# Patient Record
Sex: Female | Born: 1965 | Race: White | Hispanic: No | Marital: Married | Smoking: Never smoker
Health system: Southern US, Community
[De-identification: ages and names within clinical notes are randomized; demographics above are authoritative.]

## PROBLEM LIST (undated history)

## (undated) DIAGNOSIS — I10 Essential (primary) hypertension: Secondary | ICD-10-CM

## (undated) DIAGNOSIS — M2460 Ankylosis, unspecified joint: Secondary | ICD-10-CM

## (undated) DIAGNOSIS — R079 Chest pain, unspecified: Secondary | ICD-10-CM

## (undated) DIAGNOSIS — M329 Systemic lupus erythematosus, unspecified: Secondary | ICD-10-CM

## (undated) DIAGNOSIS — J45909 Unspecified asthma, uncomplicated: Secondary | ICD-10-CM

## (undated) HISTORY — DX: Chest pain, unspecified: R07.9

## (undated) HISTORY — PX: KNEE ARTHROSCOPY: SHX127

---

## 2009-12-27 HISTORY — PX: AUGMENTATION MAMMAPLASTY: SUR837

## 2014-06-11 ENCOUNTER — Ambulatory Visit: Payer: Self-pay | Admitting: Family Medicine

## 2015-10-31 ENCOUNTER — Other Ambulatory Visit: Payer: Self-pay | Admitting: Family Medicine

## 2015-10-31 DIAGNOSIS — Z1231 Encounter for screening mammogram for malignant neoplasm of breast: Secondary | ICD-10-CM

## 2015-11-13 ENCOUNTER — Ambulatory Visit
Admission: RE | Admit: 2015-11-13 | Discharge: 2015-11-13 | Disposition: A | Discharge status: Home / Self Care | Payer: BC Managed Care – PPO | Source: Ambulatory Visit | Attending: Family Medicine | Admitting: Family Medicine

## 2015-11-13 ENCOUNTER — Other Ambulatory Visit: Payer: Self-pay | Admitting: Family Medicine

## 2015-11-13 DIAGNOSIS — Z1231 Encounter for screening mammogram for malignant neoplasm of breast: Secondary | ICD-10-CM | POA: Insufficient documentation

## 2016-11-16 ENCOUNTER — Other Ambulatory Visit: Payer: Self-pay | Admitting: Family Medicine

## 2016-11-16 DIAGNOSIS — Z1231 Encounter for screening mammogram for malignant neoplasm of breast: Secondary | ICD-10-CM

## 2016-11-22 ENCOUNTER — Ambulatory Visit
Admission: RE | Admit: 2016-11-22 | Discharge: 2016-11-22 | Disposition: A | Discharge status: Home / Self Care | Payer: BC Managed Care – PPO | Source: Ambulatory Visit | Attending: Family Medicine | Admitting: Family Medicine

## 2016-11-22 DIAGNOSIS — Z1231 Encounter for screening mammogram for malignant neoplasm of breast: Secondary | ICD-10-CM | POA: Insufficient documentation

## 2018-11-08 ENCOUNTER — Emergency Department: Payer: BC Managed Care – PPO

## 2018-11-08 ENCOUNTER — Observation Stay
Admission: EM | Admit: 2018-11-08 | Discharge: 2018-11-09 | Disposition: A | Discharge status: Home / Self Care | Payer: BC Managed Care – PPO | Attending: Internal Medicine | Admitting: Internal Medicine

## 2018-11-08 DIAGNOSIS — Z7982 Long term (current) use of aspirin: Secondary | ICD-10-CM | POA: Insufficient documentation

## 2018-11-08 DIAGNOSIS — M329 Systemic lupus erythematosus, unspecified: Secondary | ICD-10-CM | POA: Insufficient documentation

## 2018-11-08 DIAGNOSIS — R072 Precordial pain: Secondary | ICD-10-CM | POA: Diagnosis not present

## 2018-11-08 DIAGNOSIS — R0789 Other chest pain: Secondary | ICD-10-CM | POA: Diagnosis not present

## 2018-11-08 DIAGNOSIS — F4321 Adjustment disorder with depressed mood: Secondary | ICD-10-CM | POA: Diagnosis present

## 2018-11-08 DIAGNOSIS — J45909 Unspecified asthma, uncomplicated: Secondary | ICD-10-CM | POA: Diagnosis not present

## 2018-11-08 DIAGNOSIS — R7989 Other specified abnormal findings of blood chemistry: Secondary | ICD-10-CM | POA: Diagnosis not present

## 2018-11-08 DIAGNOSIS — R079 Chest pain, unspecified: Secondary | ICD-10-CM | POA: Diagnosis present

## 2018-11-08 DIAGNOSIS — G43909 Migraine, unspecified, not intractable, without status migrainosus: Secondary | ICD-10-CM | POA: Insufficient documentation

## 2018-11-08 DIAGNOSIS — I10 Essential (primary) hypertension: Secondary | ICD-10-CM | POA: Insufficient documentation

## 2018-11-08 DIAGNOSIS — F419 Anxiety disorder, unspecified: Secondary | ICD-10-CM | POA: Diagnosis not present

## 2018-11-08 HISTORY — DX: Essential (primary) hypertension: I10

## 2018-11-08 HISTORY — DX: Unspecified asthma, uncomplicated: J45.909

## 2018-11-08 HISTORY — DX: Systemic lupus erythematosus, unspecified: M32.9

## 2018-11-08 HISTORY — DX: Ankylosis, unspecified joint: M24.60

## 2018-11-08 LAB — CBC WITH DIFFERENTIAL/PLATELET
Abs Immature Granulocytes: 0.02 10*3/uL (ref 0.00–0.07)
BASOS ABS: 0.1 10*3/uL (ref 0.0–0.1)
BASOS PCT: 1 %
EOS ABS: 0.1 10*3/uL (ref 0.0–0.5)
Eosinophils Relative: 2 %
HCT: 39.7 % (ref 36.0–46.0)
Hemoglobin: 12.9 g/dL (ref 12.0–15.0)
IMMATURE GRANULOCYTES: 0 %
Lymphocytes Relative: 24 %
Lymphs Abs: 1.5 10*3/uL (ref 0.7–4.0)
MCH: 30.4 pg (ref 26.0–34.0)
MCHC: 32.5 g/dL (ref 30.0–36.0)
MCV: 93.6 fL (ref 80.0–100.0)
Monocytes Absolute: 0.5 10*3/uL (ref 0.1–1.0)
Monocytes Relative: 8 %
NEUTROS PCT: 65 %
Neutro Abs: 4 10*3/uL (ref 1.7–7.7)
PLATELETS: 234 10*3/uL (ref 150–400)
RBC: 4.24 MIL/uL (ref 3.87–5.11)
RDW: 13 % (ref 11.5–15.5)
WBC: 6.2 10*3/uL (ref 4.0–10.5)
nRBC: 0 % (ref 0.0–0.2)

## 2018-11-08 LAB — BASIC METABOLIC PANEL
ANION GAP: 8 (ref 5–15)
BUN: 16 mg/dL (ref 6–20)
CO2: 24 mmol/L (ref 22–32)
CREATININE: 0.56 mg/dL (ref 0.44–1.00)
Calcium: 9.1 mg/dL (ref 8.9–10.3)
Chloride: 109 mmol/L (ref 98–111)
GLUCOSE: 89 mg/dL (ref 70–99)
Potassium: 3.7 mmol/L (ref 3.5–5.1)
Sodium: 141 mmol/L (ref 135–145)

## 2018-11-08 LAB — TROPONIN I: Troponin I: 0.03 ng/mL (ref <0.03)

## 2018-11-08 MED ORDER — LORAZEPAM 2 MG/ML IJ SOLN
1.0000 mg | Freq: Once | INTRAMUSCULAR | Status: AC
  Administered 2018-11-08: 1 mg via INTRAVENOUS
  Filled 2018-11-08: qty 1

## 2018-11-08 MED ORDER — ALPRAZOLAM 0.25 MG PO TABS: 0.2500 mg | ORAL_TABLET | Freq: Three times a day (TID) | ORAL | 0 refills | Status: DC | PRN

## 2018-11-08 MED ORDER — NITROGLYCERIN 0.4 MG SL SUBL: 0.4000 mg | SUBLINGUAL_TABLET | SUBLINGUAL | Status: DC | PRN

## 2018-11-08 NOTE — ED Triage Notes (Signed)
Pt to ED via [facility] c/o chest pain. Per EMS pt son "died, 1 hour ago", and shortly thereafter began c/o chest pain radiating to her arm with SOB. Patient present alert and oriented x4, RR even and unlabored.

## 2018-11-08 NOTE — ED Provider Notes (Signed)
2nd trop 0.03. Discussed this with the patient. At this point unclear significance of this value. Repeat EKG without concerning changes. Discussed possible options for care at this time. Patient felt most comfortable with admission for further cardiac evaluation which I think is reasonable.    Phineas SemenGoodman, Bailey Mcginty, MD 11/08/18 [phone]

## 2018-11-08 NOTE — ED Provider Notes (Signed)
[facility] Emergency Department Provider Note  ____________________________________________  Time seen: Approximately 7:16 PM  I have reviewed the triage vital signs and the nursing notes.   HISTORY  Chief Complaint Chest Pain   HPI AUGUSTINA BRADDOCK is a 52 y.o. female with history of ankylosing spondylitis who presents for evaluation of chest pain. Patient reports that at 4 PM she received a phone call that her 39 year old son was found dead in his apartment. Patient has been under a lot of stress over the last few hours. She was attempting to get into his apartment however the police wouldn't let her. She then went to the bathroom and developed sudden onset of severe sharp chest pain radiating to her neck, left arm and back. The pain is now 6/10 and improved after she took 4 baby aspirin. No SOB, dizziness, nausea or vomiting. The patient denies any personal family history of heart attacks, DVT or PE, recent travel immobilization, leg pain or swelling, hemoptysis, or exogenous hormones. The patient denies any history of smoking.  Past Medical History:  Diagnosis Date  . Ankylosis of multiple joints     Past Surgical History:  Procedure Laterality Date  . AUGMENTATION MAMMAPLASTY Bilateral 2011  . KNEE ARTHROSCOPY      Prior to Admission medications   Not on File    Allergies Arava [leflunomide]; Lisinopril; and Plaquenil [hydroxychloroquine sulfate]  Family History  Problem Relation Age of Onset  . Breast cancer Neg Hx     Social History Social History   Tobacco Use  . Smoking status: Never Smoker  . Smokeless tobacco: Never Used  Substance Use Topics  . Alcohol use: Yes    Frequency: Never    Comment: occasional  . Drug use: Never    Review of Systems  Constitutional: Negative for fever. Eyes: Negative for visual changes. ENT: Negative for sore throat. Neck: No neck pain  Cardiovascular: + chest pain. Respiratory: Negative for  shortness of breath. Gastrointestinal: Negative for abdominal pain, vomiting or diarrhea. Genitourinary: Negative for dysuria. Musculoskeletal: Negative for back pain. Skin: Negative for rash. Neurological: Negative for headaches, weakness or numbness. Psych: No SI or HI  ____________________________________________   PHYSICAL EXAM:  VITAL SIGNS: ED Triage Vitals  Enc Vitals Group     BP 11/08/18 1906 (!) 156/105     Pulse Rate 11/08/18 1908 88     Resp 11/08/18 1908 15     Temp --      Temp src --      SpO2 11/08/18 1908 100 %     Weight 11/08/18 1904 178 lb (80.7 kg)     Height 11/08/18 1904 5\' 4"  (1.626 m)     Head Circumference --      Peak Flow --      Pain Score 11/08/18 1903 6     Pain Loc --      Pain Edu? --      Excl. in GC? --     Constitutional: Alert and oriented, patient looks upset and teary eye but otherwise in no distress.  HEENT:      Head: Normocephalic and atraumatic.         Eyes: Conjunctivae are normal. Sclera is non-icteric.       Mouth/Throat: Mucous membranes are moist.       Neck: Supple with no signs of meningismus. Cardiovascular: Regular rate and rhythm. No murmurs, gallops, or rubs. 2+ symmetrical distal pulses are present in all extremities. No JVD.  Respiratory: Normal respiratory effort. Lungs are clear to auscultation bilaterally. No wheezes, crackles, or rhonchi.  Gastrointestinal: Soft, non tender, and non distended with positive bowel sounds. No rebound or guarding. Musculoskeletal: Nontender with normal range of motion in all extremities. No edema, cyanosis, or erythema of extremities. Neurologic: Normal speech and language. Face is symmetric. Moving all extremities. No gross focal neurologic deficits are appreciated. Skin: Skin is warm, dry and intact. No rash noted. Psychiatric: Mood and affect are normal. Speech and behavior are normal.  ____________________________________________   LABS (all labs ordered are listed, but only  abnormal results are displayed)  Labs Reviewed  CBC WITH DIFFERENTIAL/PLATELET  BASIC METABOLIC PANEL  TROPONIN I  TROPONIN I   ____________________________________________  EKG  ED ECG REPORT I, Nita Sicklearolina Cherie Lasalle, the attending physician, personally viewed and interpreted this ECG.  Normal sinus rhythm, rate of 78, normal intervals, normal axis, no ST elevations or depressions. Normal EKG.  ____________________________________________  RADIOLOGY  I have personally reviewed the images performed during this visit and I agree with the Radiologist's read.   Interpretation by Radiologist:  Dg Chest 2 View  Result Date: 11/08/2018 CLINICAL DATA:  Chest pain EXAM: CHEST - 2 VIEW COMPARISON:  None. FINDINGS: No pneumothorax. The heart, hila, and mediastinum are normal. Vascular crowding in the medial right lung base. No suspicious infiltrate. IMPRESSION: Vascular crowding in the medial right lung base. No acute abnormality noted. Electronically Signed   By: Gerome Samavid  Williams III M.D   On: 11/08/2018 19:58      ____________________________________________   PROCEDURES  Procedure(s) performed: None Procedures Critical Care performed:  None ____________________________________________   INITIAL IMPRESSION / ASSESSMENT AND PLAN / ED COURSE  52 y.o. female with history of ankylosing spondylitis who presents for evaluation of sudden onset of severe chest pain radiating to her neck, L arm and back in the setting of recent loss of her 51 year old son. patient is obviously very upset and anteriorly but in no apparent distress. Her initial EKG shows no evidence of ischemia. Ddx anxiety, ACS, Takutsubo cardiomyopathy. Perc negative. Heart score of 2, will get 2 troponin q 3hrs. Aortic dissection considered, however there were with no typical symptoms of chest pain radiating through to intrascapular back, no severe hypertension, symmetric bilateral radial pulses, no associated neurologic  deficits, no associated abdominal or lower extremity symptoms, no marfanoid features or evidence of underlying connective tissue disorder. Therefore if chest x-ray is without evidence of mediastinal widening, will not pursue further diagnosis at this time. Patient took ASA prior to arrival. Will give ativan.    Clinical Course as of Nov 08 2032  Wed Nov 08, 2018  2033 First troponin is negative. The patient's pain has resolved with IV Ativan. Second troponin is pending. Care transferred to Dr. Derrill KayGoodman.   [CV]    Clinical Course User Index [CV] Don PerkingVeronese, [place], MD     As part of my medical decision making, I reviewed the following data within the electronic MEDICAL RECORD NUMBER Nursing notes reviewed and incorporated, Labs reviewed , EKG interpreted , Old chart reviewed, Radiograph reviewed , Notes from prior ED visits and [place] Controlled Substance Database    Pertinent labs & imaging results that were available during my care of the patient were reviewed by me and considered in my medical decision making (see chart for details).    ____________________________________________   FINAL CLINICAL IMPRESSION(S) / ED DIAGNOSES  Final diagnoses:  Chest pain, unspecified type  Grieving  NEW MEDICATIONS STARTED DURING THIS VISIT:  ED Discharge Orders    None       Note:  This document was prepared using Dragon voice recognition software and may include unintentional dictation errors.    Nita Sickle, MD 11/08/18 2033

## 2018-11-08 NOTE — H&P (Signed)
[facility] -  at [facility]   PATIENT NAME: Bailey Castillo    MR#:  [id number]  DATE OF BIRTH:  [birth year]  DATE OF ADMISSION:  11/08/2018  PRIMARY CARE PHYSICIAN: Clear, Walker Kehr, FNP   REQUESTING/REFERRING PHYSICIAN: Derrill Kay, MD  CHIEF COMPLAINT:   Chief Complaint  Patient presents with  . Chest Pain    HISTORY OF PRESENT ILLNESS:  Bailey Castillo  is a 52 y.o. female who presents with chief complaint as above.  Patient presents with a complaint of chest pain.  She states that her chest pain developed today after hearing that her son had passed away.  She describes the pain as centrally located, pressure-like, radiating to her left shoulder and her left neck.  She has a history of ankylosing spondylitis and states that for the past several weeks she has been having pain down the left side of her head and neck, radiating to her shoulder.  However, she has no personal prior cardiac history, no strong family history of cardiac disease.  Risk factors are minimum.  Here in the ED her second troponin was found to be barely positive at 0.03, this after a normal first troponin.  Hospitalist were called for admission as the patient has somewhat persistent symptoms.  PAST MEDICAL HISTORY:   Past Medical History:  Diagnosis Date  . Ankylosis of multiple joints   . Asthma   . HTN (hypertension)   . Lupus (HCC)      PAST SURGICAL HISTORY:   Past Surgical History:  Procedure Laterality Date  . AUGMENTATION MAMMAPLASTY Bilateral 2011  . KNEE ARTHROSCOPY       SOCIAL HISTORY:   Social History   Tobacco Use  . Smoking status: Never Smoker  . Smokeless tobacco: Never Used  Substance Use Topics  . Alcohol use: Yes    Frequency: Never    Comment: occasional     FAMILY HISTORY:   Family History  Problem Relation Age of Onset  . Hypertension Mother   . Asthma Sister   . Colon cancer Paternal Grandmother   . Breast cancer Neg Hx       DRUG ALLERGIES:   Allergies  Allergen Reactions  . Arava [Leflunomide]     Elevated BP  . Lisinopril     cough  . Plaquenil [Hydroxychloroquine Sulfate]     hearing loss in left ear    MEDICATIONS AT HOME:   Prior to Admission medications   Medication Sig Start Date End Date Taking? Authorizing Provider  ALPRAZolam (XANAX) 0.25 MG tablet Take 1 tablet (0.25 mg total) by mouth 3 (three) times daily as needed for anxiety. 11/08/18 11/08/19  Nita Sickle, MD    REVIEW OF SYSTEMS:  Review of Systems  Constitutional: Negative for chills, fever, malaise/fatigue and weight loss.  HENT: Negative for ear pain, hearing loss and tinnitus.   Eyes: Negative for blurred vision, double vision, pain and redness.  Respiratory: Negative for cough, hemoptysis and shortness of breath.   Cardiovascular: Positive for chest pain. Negative for palpitations, orthopnea and leg swelling.  Gastrointestinal: Negative for abdominal pain, constipation, diarrhea, nausea and vomiting.  Genitourinary: Negative for dysuria, frequency and hematuria.  Musculoskeletal: Negative for back pain, joint pain and neck pain.  Skin:       No acne, rash, or lesions  Neurological: Negative for dizziness, tremors, focal weakness and weakness.  Endo/Heme/Allergies: Negative for polydipsia. Does not bruise/bleed easily.  Psychiatric/Behavioral: Negative for depression. The patient is not nervous/anxious and  does not have insomnia.      VITAL SIGNS:   Vitals:   11/08/18 1909 11/08/18 2000 11/08/18 2100 11/08/18 2300  BP: (!) 156/105 (!) 150/107 (!) 142/95 (!) 149/95  Pulse: 85 72 68 89  Resp: 18 19 14 13   SpO2: 100% 98% 99% 99%  Weight:      Height:       Wt Readings from Last 3 Encounters:  11/08/18 80.7 kg    PHYSICAL EXAMINATION:  Physical Exam  Vitals reviewed. Constitutional: She is oriented to person, place, and time. She appears well-developed and well-nourished. No distress.  HENT:  Head:  Normocephalic and atraumatic.  Mouth/Throat: Oropharynx is clear and moist.  Eyes: Pupils are equal, round, and reactive to light. Conjunctivae and EOM are normal. No scleral icterus.  Neck: Normal range of motion. Neck supple. No JVD present. No thyromegaly present.  Cardiovascular: Normal rate, regular rhythm and intact distal pulses. Exam reveals no gallop and no friction rub.  No murmur heard. Respiratory: Effort normal and breath sounds normal. No respiratory distress. She has no wheezes. She has no rales.  GI: Soft. Bowel sounds are normal. She exhibits no distension. There is no tenderness.  Musculoskeletal: Normal range of motion. She exhibits no edema.  No arthritis, no gout  Lymphadenopathy:    She has no cervical adenopathy.  Neurological: She is alert and oriented to person, place, and time. No cranial nerve deficit.  No dysarthria, no aphasia  Skin: Skin is warm and dry. No rash noted. No erythema.  Psychiatric: She has a normal mood and affect. Her behavior is normal. Judgment and thought content normal.    LABORATORY PANEL:   CBC Recent Labs  Lab 11/08/18 1917  WBC 6.2  HGB 12.9  HCT 39.7  PLT 234   ------------------------------------------------------------------------------------------------------------------  Chemistries  Recent Labs  Lab 11/08/18 1917  NA 141  K 3.7  CL 109  CO2 24  GLUCOSE 89  BUN 16  CREATININE 0.56  CALCIUM 9.1   ------------------------------------------------------------------------------------------------------------------  Cardiac Enzymes Recent Labs  Lab 11/08/18 2218  TROPONINI 0.03*   ------------------------------------------------------------------------------------------------------------------  RADIOLOGY:  Dg Chest 2 View  Result Date: 11/08/2018 CLINICAL DATA:  Chest pain EXAM: CHEST - 2 VIEW COMPARISON:  None. FINDINGS: No pneumothorax. The heart, hila, and mediastinum are normal. Vascular crowding in the  medial right lung base. No suspicious infiltrate. IMPRESSION: Vascular crowding in the medial right lung base. No acute abnormality noted. Electronically Signed   By: Gerome Samavid  Williams III M.D   On: 11/08/2018 19:58    EKG:   Orders placed or performed during the hospital encounter of 11/08/18  . ED EKG  . ED EKG  . EKG 12-Lead  . EKG 12-Lead  . EKG 12-Lead  . EKG 12-Lead    IMPRESSION AND PLAN:  Principal Problem:   Chest pain -first troponin normal, second troponin barely positive at 0.03.  EKG is reassuring without significant ischemic pathology.  We will trend her cardiac enzymes, get an echocardiogram and a cardiology consult Active Problems:   Asthma -continue home meds   HTN (hypertension) -continue home meds  Chart review performed and case discussed with ED provider. Labs, imaging and/or ECG reviewed by provider and discussed with patient/family. Management plans discussed with the patient and/or family.  DVT PROPHYLAXIS: SubQ lovenox   GI PROPHYLAXIS:  None  ADMISSION STATUS: Observation  CODE STATUS: Full  TOTAL TIME TAKING CARE OF THIS PATIENT: 40 minutes.   Donnamarie Shankles FIELDING 11/08/2018, 11:14 PM  [facility]  Office  [phone]  CC: Primary care physician; Clear, Walker Kehr, FNP  Note:  This document was prepared using Dragon voice recognition software and may include unintentional dictation errors.

## 2018-11-08 NOTE — ED Notes (Signed)
Patient transported to X-ray 

## 2018-11-09 ENCOUNTER — Other Ambulatory Visit: Payer: Self-pay

## 2018-11-09 ENCOUNTER — Observation Stay (HOSPITAL_BASED_OUTPATIENT_CLINIC_OR_DEPARTMENT_OTHER)
Admit: 2018-11-09 | Discharge: 2018-11-09 | Disposition: A | Discharge status: Home / Self Care | Payer: BC Managed Care – PPO | Attending: Internal Medicine | Admitting: Internal Medicine

## 2018-11-09 ENCOUNTER — Observation Stay: Payer: BC Managed Care – PPO

## 2018-11-09 DIAGNOSIS — I5181 Takotsubo syndrome: Secondary | ICD-10-CM | POA: Diagnosis not present

## 2018-11-09 DIAGNOSIS — R079 Chest pain, unspecified: Secondary | ICD-10-CM | POA: Diagnosis not present

## 2018-11-09 LAB — TROPONIN I
TROPONIN I: 0.05 ng/mL — AB (ref <0.03)
Troponin I: 0.03 ng/mL (ref <0.03)
Troponin I: 0.03 ng/mL (ref <0.03)

## 2018-11-09 LAB — CBC
HCT: 36.9 % (ref 36.0–46.0)
Hemoglobin: 12 g/dL (ref 12.0–15.0)
MCH: 30.7 pg (ref 26.0–34.0)
MCHC: 32.5 g/dL (ref 30.0–36.0)
MCV: 94.4 fL (ref 80.0–100.0)
NRBC: 0 % (ref 0.0–0.2)
PLATELETS: 218 10*3/uL (ref 150–400)
RBC: 3.91 MIL/uL (ref 3.87–5.11)
RDW: 13.2 % (ref 11.5–15.5)
WBC: 5.3 10*3/uL (ref 4.0–10.5)

## 2018-11-09 LAB — BASIC METABOLIC PANEL
ANION GAP: 7 (ref 5–15)
BUN: 17 mg/dL (ref 6–20)
CALCIUM: 8.7 mg/dL — AB (ref 8.9–10.3)
CHLORIDE: 110 mmol/L (ref 98–111)
CO2: 25 mmol/L (ref 22–32)
CREATININE: 0.6 mg/dL (ref 0.44–1.00)
GFR calc non Af Amer: >60 mL/min (ref >60)
Glucose, Bld: 95 mg/dL (ref 70–99)
Potassium: 3.7 mmol/L (ref 3.5–5.1)
SODIUM: 142 mmol/L (ref 135–145)

## 2018-11-09 LAB — ECHOCARDIOGRAM COMPLETE
Height: 64 in
Weight: 2384 oz

## 2018-11-09 MED ORDER — KETOROLAC TROMETHAMINE 15 MG/ML IJ SOLN
15.0000 mg | Freq: Once | INTRAMUSCULAR | Status: AC
  Administered 2018-11-09: 15 mg via INTRAVENOUS
  Filled 2018-11-09: qty 1

## 2018-11-09 MED ORDER — ACETAMINOPHEN 650 MG RE SUPP: 650.0000 mg | Freq: Four times a day (QID) | RECTAL | Status: DC | PRN

## 2018-11-09 MED ORDER — OXYCODONE HCL 5 MG PO TABS: 5.0000 mg | ORAL_TABLET | ORAL | Status: DC | PRN

## 2018-11-09 MED ORDER — GADOBUTROL 1 MMOL/ML IV SOLN
6.5000 mL | Freq: Once | INTRAVENOUS | Status: AC | PRN
  Administered 2018-11-09: 6.5 mL via INTRAVENOUS

## 2018-11-09 MED ORDER — LORAZEPAM 2 MG/ML IJ SOLN
0.5000 mg | Freq: Once | INTRAMUSCULAR | Status: AC | PRN
  Administered 2018-11-09: 0.5 mg via INTRAVENOUS
  Filled 2018-11-09: qty 1

## 2018-11-09 MED ORDER — ACETAMINOPHEN 325 MG PO TABS: 650.0000 mg | ORAL_TABLET | Freq: Four times a day (QID) | ORAL | Status: DC | PRN

## 2018-11-09 MED ORDER — TRAZODONE HCL 50 MG PO TABS
50.0000 mg | ORAL_TABLET | Freq: Every evening | ORAL | Status: DC | PRN
  Filled 2018-11-09: qty 1

## 2018-11-09 MED ORDER — ALPRAZOLAM 0.25 MG PO TABS: 0.2500 mg | ORAL_TABLET | Freq: Three times a day (TID) | ORAL | 0 refills | Status: AC | PRN

## 2018-11-09 MED ORDER — ASPIRIN EC 81 MG PO TBEC: 81.0000 mg | DELAYED_RELEASE_TABLET | Freq: Every day | ORAL | 0 refills | Status: AC

## 2018-11-09 MED ORDER — ONDANSETRON HCL 4 MG PO TABS: 4.0000 mg | ORAL_TABLET | Freq: Four times a day (QID) | ORAL | Status: DC | PRN

## 2018-11-09 MED ORDER — METOPROLOL SUCCINATE ER 25 MG PO TB24: 25.0000 mg | ORAL_TABLET | Freq: Every day | ORAL | 0 refills | Status: DC

## 2018-11-09 MED ORDER — ENOXAPARIN SODIUM 40 MG/0.4ML ~~LOC~~ SOLN: 40.0000 mg | SUBCUTANEOUS | Status: DC

## 2018-11-09 MED ORDER — ONDANSETRON HCL 4 MG/2ML IJ SOLN: 4.0000 mg | Freq: Four times a day (QID) | INTRAMUSCULAR | Status: DC | PRN

## 2018-11-09 MED ORDER — MAGNESIUM SULFATE 2 GM/50ML IV SOLN
2.0000 g | Freq: Once | INTRAVENOUS | Status: AC
  Administered 2018-11-09: 2 g via INTRAVENOUS
  Filled 2018-11-09: qty 50

## 2018-11-09 NOTE — Discharge Summary (Addendum)
[facility] - [place] at [facility]   PATIENT NAME: Bailey Castillo    MR#:  [id number]  DATE OF BIRTH:  November 10, 1966  DATE OF ADMISSION:  11/08/2018 ADMITTING PHYSICIAN: Oralia Manis, MD  DATE OF DISCHARGE: 11/09/2018  PRIMARY CARE PHYSICIAN: Clear, Walker Kehr, FNP    ADMISSION DIAGNOSIS:  Grieving [F43.21] Chest pain, unspecified type [R07.9]  DISCHARGE DIAGNOSIS:  Principal Problem:   Chest pain Active Problems:   Asthma   HTN (hypertension)   SECONDARY DIAGNOSIS:   Past Medical History:  Diagnosis Date  . Ankylosis of multiple joints   . Asthma   . HTN (hypertension)   . Lupus (HCC)     HOSPITAL COURSE:   1.  Atypical chest pain with borderline elevated troponins.  Troponin maxed out at 0.05 and then came down to 0.03.  The patient's son just died yesterday suddenly.  She developed sub-sternal chest pain radiating to her neck back and shoulder.  Patient was seen in consultation by cardiology.  Echocardiogram was done which showed a normal ejection fraction.  Cardiology decided not to do anything further but recommended a low-dose beta-blocker and aspirin.  Follow-up with cardiology as outpatient. 2.  Chronic migraine headaches.  MRI of the brain was negative.  MRI of the cervical spine did show some herniated disc but a little too low to be causing headaches.  Would recommend outpatient follow-up for discussions on chronic medications to prevent migraines.  I did give some magnesium and Toradol here. 3.  History of ankylosing spondylitis and arthritis.  Morning stiffness 4.  Hypertension prescribed Toprol-XL. 5.  Patient asking for some anxiety medication prior to going home.  10 pills of Xanax prescribed  DISCHARGE CONDITIONS:   Satisfactory  CONSULTS OBTAINED:  Treatment Team:  Iran Ouch, MD  DRUG ALLERGIES:   Allergies  Allergen Reactions  . Arava [Leflunomide]     Elevated BP  . Lisinopril     cough  . Plaquenil  [Hydroxychloroquine Sulfate]     hearing loss in left ear    DISCHARGE MEDICATIONS:   Allergies as of 11/09/2018      Reactions   Arava [leflunomide]    Elevated BP   Lisinopril    cough   Plaquenil [hydroxychloroquine Sulfate]    hearing loss in left ear      Medication List    STOP taking these medications   SUMAtriptan 100 MG tablet Commonly known as:  IMITREX     TAKE these medications   ALPRAZolam 0.25 MG tablet Commonly known as:  XANAX Take 1 tablet (0.25 mg total) by mouth 3 (three) times daily as needed for anxiety.   aspirin EC 81 MG tablet Take 1 tablet (81 mg total) by mouth daily.   COSENTYX SENSOREADY (300 MG) 150 MG/ML Soaj Generic drug:  Secukinumab (300 MG Dose) Inject 300 mg as directed every 30 (thirty) days.   diclofenac sodium 1 % Gel Commonly known as:  VOLTAREN Apply 4 g topically 4 (four) times daily as needed.   metoprolol succinate 25 MG 24 hr tablet Commonly known as:  TOPROL-XL Take 1 tablet (25 mg total) by mouth daily.        DISCHARGE INSTRUCTIONS:   Follow-up PMD in around 5 days Follow-up cardiology 2 weeks Recommend outpatient neurology follow-up for headaches  If you experience worsening of your admission symptoms, develop shortness of breath, life threatening emergency, suicidal or homicidal thoughts you must seek medical attention immediately by calling 911 or calling your  MD immediately  if symptoms less severe.  You Must read complete instructions/literature along with all the possible adverse reactions/side effects for all the Medicines you take and that have been prescribed to you. Take any new Medicines after you have completely understood and accept all the possible adverse reactions/side effects.   Please note  You were cared for by a hospitalist during your hospital stay. If you have any questions about your discharge medications or the care you received while you were in the hospital after you are discharged, you  can call the unit and asked to speak with the hospitalist on call if the hospitalist that took care of you is not available. Once you are discharged, your primary care physician will handle any further medical issues. Please note that NO REFILLS for any discharge medications will be authorized once you are discharged, as it is imperative that you return to your primary care physician (or establish a relationship with a primary care physician if you do not have one) for your aftercare needs so that they can reassess your need for medications and monitor your lab values.    Today   CHIEF COMPLAINT:   Chief Complaint  Patient presents with  . Chest Pain    HISTORY OF PRESENT ILLNESS:  Bailey Castillo  is a 52 y.o. female presents with chest pain   VITAL SIGNS:  Blood pressure (!) 143/83, pulse 85, temperature 98.3 F (36.8 C), temperature source Oral, resp. rate 14, height 5\' 4"  (1.626 m), weight 67.6 kg, SpO2 97 %.   PHYSICAL EXAMINATION:  GENERAL:  52 y.o.-year-old patient lying in the bed with no acute distress.  EYES: Pupils equal, round, reactive to light and accommodation. No scleral icterus. Extraocular muscles intact.  HEENT: Head atraumatic, normocephalic. Oropharynx and nasopharynx clear.  NECK:  Supple, no jugular venous distention. No thyroid enlargement, no tenderness.  LUNGS: Normal breath sounds bilaterally, no wheezing, rales,rhonchi or crepitation. No use of accessory muscles of respiration.  CARDIOVASCULAR: S1, S2 normal. No murmurs, rubs, or gallops.  ABDOMEN: Soft, non-tender, non-distended. Bowel sounds present. No organomegaly or mass.  EXTREMITIES: No pedal edema, cyanosis, or clubbing.  NEUROLOGIC: Cranial nerves II through XII are intact. Muscle strength 5/5 in all extremities. Sensation intact. Gait not checked.  PSYCHIATRIC: The patient is alert and oriented x 3.  SKIN: No obvious rash, lesion, or ulcer.   DATA REVIEW:   CBC Recent Labs  Lab 11/09/18 0630   WBC 5.3  HGB 12.0  HCT 36.9  PLT 218    Chemistries  Recent Labs  Lab 11/09/18 0630  NA 142  K 3.7  CL 110  CO2 25  GLUCOSE 95  BUN 17  CREATININE 0.60  CALCIUM 8.7*    Cardiac Enzymes Recent Labs  Lab 11/09/18 1237  TROPONINI 0.03*     RADIOLOGY:  Dg Chest 2 View  Result Date: 11/08/2018 CLINICAL DATA:  Chest pain EXAM: CHEST - 2 VIEW COMPARISON:  None. FINDINGS: No pneumothorax. The heart, hila, and mediastinum are normal. Vascular crowding in the medial right lung base. No suspicious infiltrate. IMPRESSION: Vascular crowding in the medial right lung base. No acute abnormality noted. Electronically Signed   By: Gerome Sam III M.D   On: 11/08/2018 19:58   Mr Brain W Wo Contrast  Result Date: 11/09/2018 CLINICAL DATA:  Initial evaluation for headaches with neck pain for 2 months. EXAM: MRI HEAD WITHOUT AND WITH CONTRAST MRI CERVICAL SPINE WITHOUT CONTRAST TECHNIQUE: Multiplanar, multiecho pulse sequences of  the brain and surrounding structures, and cervical spine, to include the craniocervical junction and cervicothoracic junction, were obtained without intravenous contrast. CONTRAST:  6.5 cc of Gadavist. COMPARISON:  None available. FINDINGS: MRI HEAD FINDINGS Brain: Cerebral volume within normal limits for patient age. No focal parenchymal signal abnormality identified. No abnormal foci of restricted diffusion to suggest acute or subacute ischemia. Gray-white matter differentiation well maintained. No encephalomalacia to suggest chronic infarction. No foci of susceptibility artifact to suggest acute or chronic intracranial hemorrhage. Mass lesion, midline shift or mass effect. No hydrocephalus. No extra-axial fluid collection. Major dural sinuses are grossly patent. Pituitary gland and suprasellar region are normal. Midline structures intact and normal. No abnormal enhancement. Vascular: Major intracranial vascular flow voids well maintained and normal in appearance.  Skull and upper cervical spine: Craniocervical junction normal. Visualized upper cervical spine within normal limits. Bone marrow signal intensity normal. No scalp soft tissue abnormality. Sinuses/Orbits: Globes and orbital soft tissues within normal limits. Axial myopia noted. Paranasal sinuses are clear. No mastoid effusion. Inner ear structures normal. Other: None. MRI CERVICAL SPINE FINDINGS Alignment: Vertebral bodies normally aligned with preservation of the normal cervical lordosis. No listhesis. Vertebrae: Vertebral body heights well maintained without evidence for acute or chronic fracture. Bone marrow signal intensity within normal limits. Small benign hemangioma noted within the C2 vertebral body. No other discrete or worrisome osseous lesions. No abnormal marrow edema. Cord: Signal intensity within the cervical spinal cord is normal. Posterior Fossa, vertebral arteries, paraspinal tissues: Craniocervical junction normal. Paraspinous and prevertebral soft tissues within normal limits. Normal intravascular flow voids present within the vertebral arteries bilaterally. Disc levels: C2-C3: Unremarkable. C3-C4: Mild disc bulge with uncovertebral hypertrophy. Mild bilateral facet hypertrophy. No significant spinal stenosis. Moderate bilateral C4 foraminal narrowing. C4-C5: Mild uncovertebral and facet hypertrophy. No significant canal or foraminal stenosis. C5-C6: Broad posterior disc bulge indents and effaces the ventral CSF. Mild spinal stenosis without cord deformity. Superimposed mild uncovertebral hypertrophy without significant foraminal narrowing. C6-C7: Broad posterior disc bulge indents and partially effaces the ventral CSF. Mild spinal stenosis without cord deformity. Foramina remain patent. C7-T1:  Unremarkable. T1-2: Unremarkable. T2-3: Unremarkable. T3-4: Seen only on sagittal projection. Central disc protrusion indents the ventral thecal sac. Resultant mild spinal stenosis. Foramina remain patent.  IMPRESSION: MRI HEAD IMPRESSION: Normal MRI of the brain. No acute intracranial abnormality identified. MRI CERVICAL SPINE IMPRESSION: 1. Degenerative disc bulging at C5-6 and C6-7 with resultant mild spinal stenosis. 2. Small central disc protrusion at T3-4 with resultant mild spinal stenosis. 3. Mild degenerative disc osteophyte at C3-4 with resultant moderate bilateral C4 foraminal stenosis. Electronically Signed   By: Rise MuBenjamin  McClintock M.D.   On: 11/09/2018 14:51   Mr Cervical Spine Wo Contrast  Result Date: 11/09/2018 CLINICAL DATA:  Initial evaluation for headaches with neck pain for 2 months. EXAM: MRI HEAD WITHOUT AND WITH CONTRAST MRI CERVICAL SPINE WITHOUT CONTRAST TECHNIQUE: Multiplanar, multiecho pulse sequences of the brain and surrounding structures, and cervical spine, to include the craniocervical junction and cervicothoracic junction, were obtained without intravenous contrast. CONTRAST:  6.5 cc of Gadavist. COMPARISON:  None available. FINDINGS: MRI HEAD FINDINGS Brain: Cerebral volume within normal limits for patient age. No focal parenchymal signal abnormality identified. No abnormal foci of restricted diffusion to suggest acute or subacute ischemia. Gray-white matter differentiation well maintained. No encephalomalacia to suggest chronic infarction. No foci of susceptibility artifact to suggest acute or chronic intracranial hemorrhage. Mass lesion, midline shift or mass effect. No hydrocephalus. No extra-axial fluid collection. Major  dural sinuses are grossly patent. Pituitary gland and suprasellar region are normal. Midline structures intact and normal. No abnormal enhancement. Vascular: Major intracranial vascular flow voids well maintained and normal in appearance. Skull and upper cervical spine: Craniocervical junction normal. Visualized upper cervical spine within normal limits. Bone marrow signal intensity normal. No scalp soft tissue abnormality. Sinuses/Orbits: Globes and orbital  soft tissues within normal limits. Axial myopia noted. Paranasal sinuses are clear. No mastoid effusion. Inner ear structures normal. Other: None. MRI CERVICAL SPINE FINDINGS Alignment: Vertebral bodies normally aligned with preservation of the normal cervical lordosis. No listhesis. Vertebrae: Vertebral body heights well maintained without evidence for acute or chronic fracture. Bone marrow signal intensity within normal limits. Small benign hemangioma noted within the C2 vertebral body. No other discrete or worrisome osseous lesions. No abnormal marrow edema. Cord: Signal intensity within the cervical spinal cord is normal. Posterior Fossa, vertebral arteries, paraspinal tissues: Craniocervical junction normal. Paraspinous and prevertebral soft tissues within normal limits. Normal intravascular flow voids present within the vertebral arteries bilaterally. Disc levels: C2-C3: Unremarkable. C3-C4: Mild disc bulge with uncovertebral hypertrophy. Mild bilateral facet hypertrophy. No significant spinal stenosis. Moderate bilateral C4 foraminal narrowing. C4-C5: Mild uncovertebral and facet hypertrophy. No significant canal or foraminal stenosis. C5-C6: Broad posterior disc bulge indents and effaces the ventral CSF. Mild spinal stenosis without cord deformity. Superimposed mild uncovertebral hypertrophy without significant foraminal narrowing. C6-C7: Broad posterior disc bulge indents and partially effaces the ventral CSF. Mild spinal stenosis without cord deformity. Foramina remain patent. C7-T1:  Unremarkable. T1-2: Unremarkable. T2-3: Unremarkable. T3-4: Seen only on sagittal projection. Central disc protrusion indents the ventral thecal sac. Resultant mild spinal stenosis. Foramina remain patent. IMPRESSION: MRI HEAD IMPRESSION: Normal MRI of the brain. No acute intracranial abnormality identified. MRI CERVICAL SPINE IMPRESSION: 1. Degenerative disc bulging at C5-6 and C6-7 with resultant mild spinal stenosis. 2.  Small central disc protrusion at T3-4 with resultant mild spinal stenosis. 3. Mild degenerative disc osteophyte at C3-4 with resultant moderate bilateral C4 foraminal stenosis. Electronically Signed   By: Rise Mu M.D.   On: 11/09/2018 14:51    Management plans discussed with the patient, family and they are in agreement.  CODE STATUS:     Code Status Orders  (From admission, onward)         Start     Ordered   11/09/18 0026  Full code  Continuous     11/09/18 0025        Code Status History    This patient has a current code status but no historical code status.      TOTAL TIME TAKING CARE OF THIS PATIENT: 35 minutes.    Alford Highland M.D on 11/09/2018 at 4:50 PM  Between 7am to 6pm - Pager - [phone]  After 6pm go to [email or web address] - Social research officer, government  [facility] Office  [phone]  CC: Primary care physician; Clear, Walker Kehr, FNP

## 2018-11-09 NOTE — Progress Notes (Signed)
*  PRELIMINARY RESULTS* Echocardiogram 2D Echocardiogram has been performed.  Bailey GulaJoan M Exzavier Castillo 11/09/2018, 11:44 AM

## 2018-11-09 NOTE — Discharge Instructions (Signed)
Nonspecific Chest Pain °Chest pain can be caused by many different conditions. There is always a chance that your pain could be related to something serious, such as a heart attack or a blood clot in your lungs. Chest pain can also be caused by conditions that are not life-threatening. If you have chest pain, it is very important to follow up with your health care provider. °What are the causes? °Causes of this condition include: °· Heartburn. °· Pneumonia or bronchitis. °· Anxiety or stress. °· Inflammation around your heart (pericarditis) or lung (pleuritis or pleurisy). °· A blood clot in your lung. °· A collapsed lung (pneumothorax). This can develop suddenly on its own (spontaneous pneumothorax) or from trauma to the chest. °· Shingles infection (varicella-zoster virus). °· Heart attack. °· Damage to the bones, muscles, and cartilage that make up your chest wall. This can include: °? Bruised bones due to injury. °? Strained muscles or cartilage due to frequent or repeated coughing or overwork. °? Fracture to one or more ribs. °? Sore cartilage due to inflammation (costochondritis). ° °What increases the risk? °Risk factors for this condition may include: °· Activities that increase your risk for trauma or injury to your chest. °· Respiratory infections or conditions that cause frequent coughing. °· Medical conditions or overeating that can cause heartburn. °· Heart disease or family history of heart disease. °· Conditions or health behaviors that increase your risk of developing a blood clot. °· Having had chicken pox (varicella zoster). ° °What are the signs or symptoms? °Chest pain can feel like: °· Burning or tingling on the surface of your chest or deep in your chest. °· Crushing, pressure, aching, or squeezing pain. °· Dull or sharp pain that is worse when you move, cough, or take a deep breath. °· Pain that is also felt in your back, neck, shoulder, or arm, or pain that spreads to any of these  areas. ° °Your chest pain may come and go, or it may stay constant. °How is this diagnosed? °Lab tests or other studies may be needed to find the cause of your pain. Your health care provider may have you take a test called an ECG (electrocardiogram). An ECG records your heartbeat patterns at the time the test is performed. You may also have other tests, such as: °· Transthoracic echocardiogram (TTE). In this test, sound waves are used to create a picture of the heart structures and to look at how blood flows through your heart. °· Transesophageal echocardiogram (TEE). This is a more advanced imaging test that takes images from inside your body. It allows your health care provider to see your heart in finer detail. °· Cardiac monitoring. This allows your health care provider to monitor your heart rate and rhythm in real time. °· Holter monitor. This is a portable device that records your heartbeat and can help to diagnose abnormal heartbeats. It allows your health care provider to track your heart activity for several days, if needed. °· Stress tests. These can be done through exercise or by taking medicine that makes your heart beat more quickly. °· Blood tests. °· Other imaging tests. ° °How is this treated? °Treatment depends on what is causing your chest pain. Treatment may include: °· Medicines. These may include: °? Acid blockers for heartburn. °? Anti-inflammatory medicine. °? Pain medicine for inflammatory conditions. °? Antibiotic medicine, if an infection is present. °? Medicines to dissolve blood clots. °? Medicines to treat coronary artery disease (CAD). °· Supportive care for conditions that   do not require medicines. This may include: °? Resting. °? Applying heat or cold packs to injured areas. °? Limiting activities until pain decreases. ° °Follow these instructions at home: °Medicines °· If you were prescribed an antibiotic, take it as told by your health care provider. Do not stop taking the  antibiotic even if you start to feel better. °· Take over-the-counter and prescription medicines only as told by your health care provider. °Lifestyle °· Do not use any products that contain nicotine or tobacco, such as cigarettes and e-cigarettes. If you need help quitting, ask your health care provider. °· Do not drink alcohol. °· Make lifestyle changes as directed by your health care provider. These may include: °? Getting regular exercise. Ask your health care provider to suggest some activities that are safe for you. °? Eating a heart-healthy diet. A registered dietitian can help you to learn healthy eating options. °? Maintaining a healthy weight. °? Managing diabetes, if necessary. °? Reducing stress, such as with yoga or relaxation techniques. °General instructions °· Avoid any activities that bring on chest pain. °· If heartburn is the cause for your chest pain, raise (elevate) the head of your bed about 6 inches (15 cm) by putting blocks under the legs. Sleeping with more pillows does not effectively relieve heartburn because it only changes the position of your head. °· Keep all follow-up visits as told by your health care provider. This is important. This includes any further testing if your chest pain does not go away. °Contact a health care provider if: °· Your chest pain does not go away. °· You have a rash with blisters on your chest. °· You have a fever. °· You have chills. °Get help right away if: °· Your chest pain is worse. °· You have a cough that gets worse, or you cough up blood. °· You have severe pain in your abdomen. °· You have severe weakness. °· You faint. °· You have sudden, unexplained chest discomfort. °· You have sudden, unexplained discomfort in your arms, back, neck, or jaw. °· You have shortness of breath at any time. °· You suddenly start to sweat, or your skin gets clammy. °· You feel nauseous or you vomit. °· You suddenly feel light-headed or dizzy. °· Your heart begins to beat  quickly, or it feels like it is skipping beats. °These symptoms may represent a serious problem that is an emergency. Do not wait to see if the symptoms will go away. Get medical help right away. Call your local emergency services (911 in the [place] Do not drive yourself to the hospital. °This information is not intended to replace advice given to you by your health care provider. Make sure you discuss any questions you have with your health care provider. °Document Released: 09/22/2005 Document Revised: 09/06/2016 Document Reviewed: 09/06/2016 °Elsevier Interactive Patient Education © 2017 [employer] ° °

## 2018-11-09 NOTE — Progress Notes (Signed)
Chaplain responded to an OR for an AD. Chaplain checked the chart  And found that the patient lost her 51 year old son tragically. It is  unknown how he died. Chaplain put AD aside. Pt was there with her husband , daughter, father in law and his wife. Chaplain let patient know Chaplains are available as needed. Chaplain gave prayer for the entire family. Follow up is recommended.    11/09/18 0900  Clinical Encounter Type  Visited With Patient and family together  Visit Type Initial;Spiritual support  Referral From Nurse  Spiritual Encounters  Spiritual Needs Brochure;Prayer;Emotional;Grief support

## 2018-11-09 NOTE — Consult Note (Signed)
Cardiology Consultation:   Patient ID: Bailey Castillo MRN: [id number]; DOB: 1966/02/24  Admit date: 11/08/2018 Date of Consult: 11/09/2018  Primary Care Provider: Cathlean Cower Walker Kehr, FNP Primary Cardiologist: New (seen by Kirke Corin) Primary Electrophysiologist:  None    Patient Profile:   Bailey Castillo is a 52 y.o. female with a hx of ankylosing spondylitis who is being seen today for the evaluation of chest pain and mildly elevated troponin at the request of Dr. Anne Hahn.  History of Present Illness:   Bailey Castillo is a 52 year old female with no prior cardiac history and no significant risk factors for heart disease.  The patient was brought by EMS due to chest pain.  Unfortunately, her 28 year old son died suddenly yesterday.  As she went to his apartment and after his body was taken, she fell on her knees with severe substernal chest tightness radiating to her back, neck and left shoulder.  The pain was intense and got worse with any movements or activities.  It also got worse with deep breath.  It has been continuous since then although it has subsided.  She has mild dyspnea associated with this.  No previous similar episodes. In the ED, her troponin was noted to be borderline elevated at 0.05.  Subsequently decreased to 0.03.  EKG showed normal sinus rhythm with no significant ST changes.  Past Medical History:  Diagnosis Date  . Ankylosis of multiple joints   . Asthma   . HTN (hypertension)   . Lupus [facility])     Past Surgical History:  Procedure Laterality Date  . AUGMENTATION MAMMAPLASTY Bilateral 2011  . KNEE ARTHROSCOPY       Home Medications:  Prior to Admission medications   Medication Sig Start Date End Date Taking? Authorizing Provider  COSENTYX SENSOREADY, 300 MG, 150 MG/ML SOAJ Inject 300 mg as directed every 30 (thirty) days. 08/20/18  Yes [provider]  diclofenac sodium (VOLTAREN) 1 % GEL Apply 4 g topically 4 (four) times daily as needed.  08/17/18   Yes [provider]  SUMAtriptan (IMITREX) 100 MG tablet Take 100 mg by mouth every 2 (two) hours as needed.  11/06/18  Yes [provider]  ALPRAZolam (XANAX) 0.25 MG tablet Take 1 tablet (0.25 mg total) by mouth 3 (three) times daily as needed for anxiety. 11/08/18 11/08/19  Nita Sickle, MD    Inpatient Medications: Scheduled Meds: . enoxaparin (LOVENOX) injection  40 mg Subcutaneous Q24H   Continuous Infusions:  PRN Meds: acetaminophen **OR** acetaminophen, nitroGLYCERIN, ondansetron **OR** ondansetron (ZOFRAN) IV, oxyCODONE, traZODone  Allergies:    Allergies  Allergen Reactions  . Arava [Leflunomide]     Elevated BP  . Lisinopril     cough  . Plaquenil [Hydroxychloroquine Sulfate]     hearing loss in left ear    Social History:   Social History   Socioeconomic History  . Marital status: Married    Spouse name: Not on file  . Number of children: Not on file  . Years of education: Not on file  . Highest education level: Not on file  Occupational History  . Not on file  Social Needs  . Financial resource strain: Not on file  . Food insecurity:    Worry: Not on file    Inability: Not on file  . Transportation needs:    Medical: Not on file    Non-medical: Not on file  Tobacco Use  . Smoking status: Never Smoker  . Smokeless tobacco: Never Used  Substance  and Sexual Activity  . Alcohol use: Yes    Frequency: Never    Comment: occasional  . Drug use: Never  . Sexual activity: Yes  Lifestyle  . Physical activity:    Days per week: Not on file    Minutes per session: Not on file  . Stress: Not on file  Relationships  . Social connections:    Talks on phone: Not on file    Gets together: Not on file    Attends religious service: Not on file    Active member of club or organization: Not on file    Attends meetings of clubs or organizations: Not on file    Relationship status: Not on file  . Intimate partner violence:    Fear of  current or ex partner: Not on file    Emotionally abused: Not on file    Physically abused: Not on file    Forced sexual activity: Not on file  Other Topics Concern  . Not on file  Social History Narrative  . Not on file    Family History:    Family History  Problem Relation Age of Onset  . Hypertension Mother   . Asthma Sister   . Colon cancer Paternal Grandmother   . Breast cancer Neg Hx      ROS:  Please see the history of present illness.   All other ROS reviewed and negative.     Physical Exam/Data:   Vitals:   11/08/18 2330 11/09/18 0000 11/09/18 0023 11/09/18 0354  BP: (!) 141/92 (!) 151/96 (!) 142/82 130/79  Pulse:   78 77  Resp: 12 18 18 18   Temp:   98.2 F (36.8 C) 98.2 F (36.8 C)  TempSrc:   Oral Oral  SpO2:   100% 99%  Weight:   67.6 kg   Height:        Intake/Output Summary (Last 24 hours) at 11/09/2018 0827 Last data filed at 11/09/2018 0500 Gross per 24 hour  Intake -  Output 0 ml  Net 0 ml   Filed Weights   11/08/18 1904 11/09/18 0023  Weight: 80.7 kg 67.6 kg   Body mass index is 25.58 kg/m.  General:  Well nourished, well developed, in no acute distress HEENT: normal Lymph: no adenopathy Neck: no JVD Endocrine:  No thryomegaly Vascular: No carotid bruits; FA pulses 2+ bilaterally without bruits  Cardiac:  normal S1, S2; RRR; no murmur  Lungs:  clear to auscultation bilaterally, no wheezing, rhonchi or rales  Abd: soft, nontender, no hepatomegaly  Ext: no edema Musculoskeletal:  No deformities, BUE and BLE strength normal and equal Skin: warm and dry  Neuro:  CNs 2-12 intact, no focal abnormalities noted Psych:  Normal affect   EKG:  The EKG was personally reviewed and demonstrates: Normal sinus rhythm with no significant ST or T wave changes. Telemetry:  Telemetry was personally reviewed and demonstrates: Normal sinus rhythm  Relevant CV Studies: Echocardiogram is pending  Laboratory Data:  Chemistry Recent Labs  Lab  11/08/18 1917 11/09/18 0630  NA 141 142  K 3.7 3.7  CL 109 110  CO2 24 25  GLUCOSE 89 95  BUN 16 17  CREATININE 0.56 0.60  CALCIUM 9.1 8.7*  GFRNONAA >60 >60  GFRAA >60 >60  ANIONGAP 8 7    No results for input(s): PROT, ALBUMIN, AST, ALT, ALKPHOS, BILITOT in the last 168 hours. Hematology Recent Labs  Lab 11/08/18 1917 11/09/18 0630  WBC 6.2 5.3  RBC 4.24 3.91  HGB 12.9 12.0  HCT 39.7 36.9  MCV 93.6 94.4  MCH 30.4 30.7  MCHC 32.5 32.5  RDW 13.0 13.2  PLT 234 218   Cardiac Enzymes Recent Labs  Lab 11/08/18 1917 11/08/18 2218 11/09/18 0039 11/09/18 0630  TROPONINI <0.03 0.03* 0.05* 0.03*   No results for input(s): TROPIPOC in the last 168 hours.  BNPNo results for input(s): BNP, PROBNP in the last 168 hours.  DDimer No results for input(s): DDIMER in the last 168 hours.  Radiology/Studies:  Dg Chest 2 View  Result Date: 11/08/2018 CLINICAL DATA:  Chest pain EXAM: CHEST - 2 VIEW COMPARISON:  None. FINDINGS: No pneumothorax. The heart, hila, and mediastinum are normal. Vascular crowding in the medial right lung base. No suspicious infiltrate. IMPRESSION: Vascular crowding in the medial right lung base. No acute abnormality noted. Electronically Signed   By: Gerome Sam III M.D   On: 11/08/2018 19:58    Assessment and Plan:   1. 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  She lacks significant risk factors for ischemic heart disease.  I think the first step is to obtain an echocardiogram to evaluate ejection fraction and wall motion.  If echocardiogram wall motion abnormalities are suggestive of stress-induced cardiomyopathy, then no further ischemic work-up will be recommended.  Otherwise, a Lexiscan Myoview can be considered for tomorrow. 2. Mildly elevated blood pressure: A beta-blocker and ACE inhibitor can be considered based on echocardiogram results.       For questions or updates,  please contact [facility] Please consult [email or web address] for contact info under     Signed, Lorine Bears, MD  11/09/2018 8:27 AM

## 2018-11-10 LAB — HIV ANTIBODY (ROUTINE TESTING W REFLEX): HIV Screen 4th Generation wRfx: NONREACTIVE

## 2018-11-14 ENCOUNTER — Encounter: Payer: Self-pay | Admitting: Cardiovascular Disease

## 2018-11-14 ENCOUNTER — Ambulatory Visit: Payer: BC Managed Care – PPO | Admitting: Cardiovascular Disease

## 2018-11-14 VITALS — BP 134/86 | HR 71 | Ht 64.0 in | Wt 150.5 lb

## 2018-11-14 DIAGNOSIS — R071 Chest pain on breathing: Secondary | ICD-10-CM | POA: Diagnosis not present

## 2018-11-14 DIAGNOSIS — I10 Essential (primary) hypertension: Secondary | ICD-10-CM | POA: Diagnosis not present

## 2018-11-14 NOTE — Patient Instructions (Signed)
Medication Instructions:  No changes  If you need a refill on your cardiac medications before your next appointment, please call your pharmacy.   Lab work: None ordered  Testing/Procedures: Your physician has requested that you have a renal artery duplex. During this test, an ultrasound is used to evaluate blood flow to the kidneys. Take your medications as you usually do. This will take place at [address], the [facility] office   No food after 11PM the night before.  Water is OK. (Don't drink liquids if you have been instructed not to for ANOTHER test).  Take two Extra-Strength Gas-X capsules at bedtime the night before test.   Take an additional two Extra-Strength Gas-X capsules three (3) hours before the test or first thing in the morning.    Avoid foods that produce bowel gas, for 24 hours prior to exam (see below).    No breakfast, no chewing gum, no smoking or carbonated beverages.  Patient may take morning medications with water.  Come in for test at least 15 minutes early to register.   Follow-Up: At [facility], you and your health needs are our priority.  As part of our continuing mission to provide you with exceptional heart care, we have created designated Provider Care Teams.  These Care Teams include your primary Cardiologist (physician) and Advanced Practice Providers (APPs -  Physician Assistants and Nurse Practitioners) who all work together to provide you with the care you need, when you need it. You will need a follow up appointment in 1 months.You may see Dr. Kirke CorinArida or one of the following Advanced Practice Providers on your designated Care Team:   Nicolasa Duckinghristopher Berge, NP Eula Listenyan Dunn, PA-C . Marisue IvanJacquelyn Visser, PA-C

## 2018-11-14 NOTE — Progress Notes (Signed)
Cardiology Office Note   Date:  11/14/2018   ID:  Bailey Castillo, DOB 1966-10-27, MRN [id number]  PCP:  Cathlean Cower Walker Kehr, FNP  Cardiologist:   Lorine Bears, MD   Chief Complaint  Patient presents with  . other    [facility] Chest Pain.Continues to have left sided chest tenderness , feels like a bruise when touched.Medications reviewed verbally.       History of Present Illness: Bailey Castillo is a 52 y.o. female who presents for a follow-up visit after recent hospitalization for chest pain and borderline elevated troponin thought to be due to stress-induced cardiomyopathy. She has known history of ankylosing spondylitis and presented recently with severe chest pain after the sudden death of her 81 year old son.  The pain was worse with breathing.  She was noted to have borderline elevated troponin at 0.05.  EKG showed no acute changes.  Echocardiogram showed an EF of 50 to 55%.  No obvious wall motion abnormalities but the study was overall suboptimal. She was discharged home on small dose Toprol.  She continues to have left-sided chest pain but much better than her initial presentation.  She is now able to take a deep breath.  She saw her primary care physician and blood pressure was significantly elevated with associated headache.  She was started on losartan and since then her blood pressure has improved and the headache has subsided.   Past Medical History:  Diagnosis Date  . Ankylosis of multiple joints   . Asthma   . HTN (hypertension)   . Lupus [facility])     Past Surgical History:  Procedure Laterality Date  . AUGMENTATION MAMMAPLASTY Bilateral 2011  . KNEE ARTHROSCOPY       Current Outpatient Medications  Medication Sig Dispense Refill  . ALPRAZolam (XANAX) 0.25 MG tablet Take 1 tablet (0.25 mg total) by mouth 3 (three) times daily as needed for anxiety. 10 tablet 0  . aspirin EC 81 MG tablet Take 1 tablet (81 mg total) by mouth daily. 30 tablet 0  . COSENTYX  SENSOREADY, 300 MG, 150 MG/ML SOAJ Inject 300 mg as directed every 30 (thirty) days.    . diclofenac sodium (VOLTAREN) 1 % GEL Apply 4 g topically 4 (four) times daily as needed.   3  . losartan (COZAAR) 50 MG tablet Take by mouth.    . metoprolol succinate (TOPROL XL) 25 MG 24 hr tablet Take 1 tablet (25 mg total) by mouth daily. 30 tablet 0   No current facility-administered medications for this visit.     Allergies:   Arava [leflunomide]; Lisinopril; and Plaquenil [hydroxychloroquine sulfate]    Social History:  The patient  reports that she has never smoked. She has never used smokeless tobacco. She reports that she drinks alcohol. She reports that she does not use drugs.   Family History:  The patient's family history includes Asthma in her sister; Colon cancer in her paternal grandmother; Hypertension in her mother.    ROS:  Please see the history of present illness.   Otherwise, review of systems are positive for none.   All other systems are reviewed and negative.    PHYSICAL EXAM: VS:  BP 134/86 (BP Location: Left Arm, Patient Position: Sitting, Cuff Size: Normal)   Pulse 71   Ht 5\' 4"  (1.626 m)   Wt 150 lb 8 oz (68.3 kg)   BMI 25.83 kg/m  , BMI Body mass index is 25.83 kg/m. GEN: Well nourished, well developed, in  no acute distress  HEENT: normal  Neck: no JVD, carotid bruits, or masses Cardiac: RRR; no murmurs, rubs, or gallops,no edema  Respiratory:  clear to auscultation bilaterally, normal work of breathing GI: soft, nontender, nondistended, + BS MS: no deformity or atrophy  Skin: warm and dry, no rash Neuro:  Strength and sensation are intact Psych: euthymic mood, full affect   EKG:  EKG is ordered today. The ekg ordered today demonstrates normal sinus rhythm with no significant ST or T wave changes.   Recent Labs: 11/09/2018: BUN 17; Creatinine, Ser 0.60; Hemoglobin 12.0; Platelets 218; Potassium 3.7; Sodium 142    Lipid Panel No results found for:  CHOL, TRIG, HDL, CHOLHDL, VLDL, LDLCALC, LDLDIRECT    Wt Readings from Last 3 Encounters:  11/14/18 150 lb 8 oz (68.3 kg)  11/09/18 149 lb (67.6 kg)      No flowsheet data found.    ASSESSMENT AND PLAN:  1.  Chest pain in the setting of severe emotional trauma highly suggestive of stress-induced cardiomyopathy.  Although echocardiogram showed relatively normal ejection fraction, it was done early in the course of her presentation that it is possible that wall motion abnormalities was not noted. Given that her symptoms are not suggestive of underlying coronary ischemia and she lacks significant risk factors, I think we can continue to monitor her symptoms for now especially with the significant improvement she had since her initial presentation. Continue treatment with Toprol and losartan.  2.  Essential hypertension: Blood pressure improved significantly with addition of losartan.  Continue small dose Toprol.  She has no family history of hypertension and given her relatively young age I think it is important to exclude renal artery stenosis due to fibromuscular dysplasia.  Thus, I requested renal artery duplex.    Disposition:   FU with me in 1 month  Signed,  Lorine BearsMuhammad Laasya Peyton, MD  11/14/2018 4:40 PM    [facility]

## 2018-11-28 ENCOUNTER — Encounter

## 2018-11-28 ENCOUNTER — Ambulatory Visit: Payer: BC Managed Care – PPO | Admitting: Physician Assistant

## 2018-12-14 ENCOUNTER — Ambulatory Visit (INDEPENDENT_AMBULATORY_CARE_PROVIDER_SITE_OTHER): Payer: BC Managed Care – PPO

## 2018-12-14 DIAGNOSIS — I10 Essential (primary) hypertension: Secondary | ICD-10-CM

## 2018-12-15 ENCOUNTER — Telehealth: Payer: Self-pay | Admitting: *Deleted

## 2018-12-15 NOTE — Telephone Encounter (Signed)
-----   Message from Iran OuchMuhammad A Arida, MD sent at 12/15/2018  7:46 AM EST -----  Normal renal arteries.  No evidence of renal artery stenosis.

## 2018-12-15 NOTE — Telephone Encounter (Signed)
Patient made aware of results and verbalized understanding.  

## 2018-12-15 NOTE — Telephone Encounter (Signed)
Left a message for the patient to call back.  

## 2018-12-18 ENCOUNTER — Encounter: Payer: Self-pay | Admitting: Nurse Practitioner

## 2018-12-18 ENCOUNTER — Ambulatory Visit (INDEPENDENT_AMBULATORY_CARE_PROVIDER_SITE_OTHER): Payer: BC Managed Care – PPO | Admitting: Nurse Practitioner

## 2018-12-18 VITALS — BP 110/80 | HR 60 | Ht 64.0 in | Wt 150.2 lb

## 2018-12-18 DIAGNOSIS — Z79899 Other long term (current) drug therapy: Secondary | ICD-10-CM

## 2018-12-18 DIAGNOSIS — R079 Chest pain, unspecified: Secondary | ICD-10-CM

## 2018-12-18 DIAGNOSIS — I10 Essential (primary) hypertension: Secondary | ICD-10-CM

## 2018-12-18 NOTE — Progress Notes (Signed)
Office Visit    Patient Name: Bailey Castillo Date of Encounter: 12/18/2018  Primary Care Provider:  Clear, Walker KehrKelli Elizabeth, FNP Primary Cardiologist:  Lorine BearsMuhammad Arida, MD   Chief Complaint    52 year old female with a history of ankylosing spondylitis and hypertension, who presents for follow-up related to chest pain.  Past Medical History    Past Medical History:  Diagnosis Date  . Ankylosis of multiple joints   . Asthma   . Chest pain    a. In setting of sudden death of son. Trop 0.05 in ED; b. 10/2018 Echo: EF 50-55%.  Marland Kitchen. HTN (hypertension)    a. 11/2018 Renal Duplex: Nl bilat renal arteries. No stenosis.  . Lupus [facility])    Past Surgical History:  Procedure Laterality Date  . AUGMENTATION MAMMAPLASTY Bilateral 2011  . KNEE ARTHROSCOPY      Allergies  Allergies  Allergen Reactions  . Arava [Leflunomide]     Elevated BP  . Lisinopril     cough  . Plaquenil [Hydroxychloroquine Sulfate]     hearing loss in left ear  . Toprol Xl [Metoprolol Tartrate]     Headache     History of Present Illness    52 year old female with the above past medical history including ankylosing spondylitis, hypertension, and chest pain with mild troponin elevation.  On November 08, 2018, following news that her 51 year old son had died suddenly, she developed severe substernal chest tightness radiating to her back, neck, and left shoulder, which was worse with position changes and deep breathing, and associated with dyspnea.  In the emergency department, she had mild troponin elevation to 0.05.  ECG was nonacute.  She was seen by cardiology and echocardiogram showed an EF of 50 to 55% without obvious wall motion abnormalities though imaging was suboptimal.  It was felt the presentation was most likely consistent with a stress-induced cardiomyopathy and that lack of wall motion abnormalities on echo might be explained by delay between development and symptoms and echocardiogram.  She was  initially placed on aspirin and beta-blocker therapy and then later placed on losartan by her primary care provider, in the setting of hypertension.  She was last seen in clinic on November 19, at which time she was doing well.  She has since undergone renal arterial duplex which was negative for renal artery stenosis.  Since her last visit, she reports doing well.  She occasionally notes a "twinge" of sharp chest pain that might move into her neck but overall, these episodes have been less frequent.  She has not had any significant pleuritic chest discomfort and denies any exertional symptoms.  No dyspnea, PND, orthopnea, dizziness, syncope, edema, or early satiety.  She had been having headaches and after running out of Toprol, she noticed that headaches completely resolved.  She does follow her blood pressure closely at home and on losartan only, pressure has been normal.  She is 110/80 today.  She wishes to remain off of beta-blocker therapy.  Home Medications    Prior to Admission medications   Medication Sig Start Date End Date Taking? Authorizing Provider  ALPRAZolam (XANAX) 0.25 MG tablet Take 1 tablet (0.25 mg total) by mouth 3 (three) times daily as needed for anxiety. 11/09/18 11/09/19 Yes Alford HighlandWieting, Richard, MD  aspirin EC 81 MG tablet Take 81 mg by mouth daily.   Yes [provider]  diclofenac sodium (VOLTAREN) 1 % GEL Apply 4 g topically 4 (four) times daily as needed.  08/17/18  Yes  [provider]  Ixekizumab 80 MG/ML SOAJ 160 mg once for the first dose, followed by 80 mg every 4 weeks 12/12/18  Yes [provider]  losartan (COZAAR) 50 MG tablet Take by mouth. 11/10/18  Yes [provider]    Review of Systems    Headache on beta-blocker therapy which has resolved since discontinuation.  Occasional "twinges" of sharp chest pain moving into her neck though these are becoming less and less frequent.  She denies dyspnea, palpitations, PND, orthopnea,  edema, or early satiety.  All other systems reviewed and are otherwise negative except as noted above.  Physical Exam    VS:  BP 110/80 (BP Location: Left Arm, Patient Position: Sitting, Cuff Size: Normal)   Pulse 60   Ht 5\' 4"  (1.626 m)   Wt 150 lb 4 oz (68.2 kg)   BMI 25.79 kg/m  , BMI Body mass index is 25.79 kg/m. GEN: Well nourished, well developed, in no acute distress. HEENT: normal. Neck: Supple, no JVD, carotid bruits, or masses. Cardiac: RRR, no murmurs, rubs, or gallops. No clubbing, cyanosis, edema.  Radials/DP/PT 2+ and equal bilaterally.  Respiratory:  Respirations regular and unlabored, clear to auscultation bilaterally. GI: Soft, nontender, nondistended, BS + x 4. MS: no deformity or atrophy. Skin: warm and dry, no rash. Neuro:  Strength and sensation are intact. Psych: Normal affect.  Accessory Clinical Findings    ECG personally reviewed by me today -regular sinus rhythm, 60- no acute changes.  Assessment & Plan    1.  Chest pain: Patient presented to the emergency department in November with chest pain in the setting of severe emotional trauma which was highly suggestive of stress-induced cardiomyopathy.  Echocardiogram showed low normal LV function with an EF of 50 to 55% though imaging was suboptimal overall.  There were no wall motion abnormalities.  She had been on beta-blocker therapy but noted headaches recently and then ran out of it about 5 days ago.  Since running out of beta-blocker, headaches have resolved and she wishes to avoid resumption.  Blood pressure today on losartan only, is 110/80.  Heart rate is 60.  She will remain off of beta-blocker.  2.  Essential hypertension: Stable on losartan therapy only.  110/80 today.  She has not had a basic metabolic panel since initiation of losartan.  I will follow-up today.  Recent renal arterial duplex was negative for renal artery stenosis.  3.  Ankylosing spondylitis: Followed closely by primary care and  rheumatology.  4.  Disposition: Follow-up with Dr. Kirke CorinArida in 3 to 6 months or sooner if necessary.  Nicolasa Duckinghristopher , NP 12/18/2018, 10:29 AM

## 2018-12-18 NOTE — Patient Instructions (Signed)
Medication Instructions:  Your physician has recommended you make the following change in your medication:  1- ok to remain of the Metoprolol.  If you need a refill on your cardiac medications before your next appointment, please call your pharmacy.   Lab work: Your physician recommends that you return for lab work in: TODAY - BMET.  If you have labs (blood work) drawn today and your tests are completely normal, you will receive your results only by: Marland Kitchen. MyChart Message (if you have MyChart) OR . A paper copy in the mail If you have any lab test that is abnormal or we need to change your treatment, we will call you to review the results.  Testing/Procedures: none  Follow-Up: At [facility], you and your health needs are our priority.  As part of our continuing mission to provide you with exceptional heart care, we have created designated Provider Care Teams.  These Care Teams include your primary Cardiologist (physician) and Advanced Practice Providers (APPs -  Physician Assistants and Nurse Practitioners) who all work together to provide you with the care you need, when you need it. You will need a follow up appointment in 6 months.  Please call our office 2 months in advance to schedule this appointment.  You may see Lorine BearsMuhammad Arida, MD.

## 2018-12-19 ENCOUNTER — Telehealth: Payer: Self-pay | Admitting: *Deleted

## 2018-12-19 LAB — BASIC METABOLIC PANEL
BUN/Creatinine Ratio: 21 (ref 9–23)
BUN: 15 mg/dL (ref 6–24)
CO2: 22 mmol/L (ref 20–29)
CREATININE: 0.73 mg/dL (ref 0.57–1.00)
Calcium: 10 mg/dL (ref 8.7–10.2)
Chloride: 101 mmol/L (ref 96–106)
GFR calc Af Amer: 110 mL/min/{1.73_m2} (ref >59)
GFR, EST NON AFRICAN AMERICAN: 96 mL/min/{1.73_m2} (ref >59)
Glucose: 87 mg/dL (ref 65–99)
Potassium: 4.5 mmol/L (ref 3.5–5.2)
Sodium: 140 mmol/L (ref 134–144)

## 2018-12-19 NOTE — Telephone Encounter (Signed)
-----   Message from Christopher Ronald Berge, NP sent at 12/19/2018  8:08 AM EST ----- Renal fxn/lytes stable.  Cont current dose of losartan. 

## 2018-12-19 NOTE — Telephone Encounter (Signed)
No answer. Left message to call back.   

## 2018-12-21 NOTE — Telephone Encounter (Signed)
Call to patient to discuss lab results. Pt verbalized understanding and agreeable to POC>   Advised pt to call for any further questions or concerns

## 2018-12-21 NOTE — Telephone Encounter (Signed)
-----   Message from Creig Hineshristopher Ronald Berge, NP sent at 12/19/2018  8:08 AM EST ----- Renal fxn/lytes stable.  Cont current dose of losartan.

## 2020-07-27 DIAGNOSIS — U071 COVID-19: Secondary | ICD-10-CM

## 2020-07-27 HISTORY — DX: COVID-19: U07.1

## 2020-11-19 ENCOUNTER — Ambulatory Visit (INDEPENDENT_AMBULATORY_CARE_PROVIDER_SITE_OTHER): Payer: BC Managed Care – PPO | Admitting: Nurse Practitioner

## 2020-11-19 ENCOUNTER — Encounter: Payer: Self-pay | Admitting: Nurse Practitioner

## 2020-11-19 ENCOUNTER — Other Ambulatory Visit: Payer: Self-pay

## 2020-11-19 VITALS — BP 110/80 | HR 76 | Ht 64.0 in | Wt 145.3 lb

## 2020-11-19 DIAGNOSIS — Z79899 Other long term (current) drug therapy: Secondary | ICD-10-CM | POA: Diagnosis not present

## 2020-11-19 DIAGNOSIS — R079 Chest pain, unspecified: Secondary | ICD-10-CM

## 2020-11-19 DIAGNOSIS — I10 Essential (primary) hypertension: Secondary | ICD-10-CM

## 2020-11-19 MED ORDER — LOSARTAN POTASSIUM 25 MG PO TABS: 25.0000 mg | ORAL_TABLET | Freq: Every day | ORAL | 3 refills | Status: DC

## 2020-11-19 NOTE — Progress Notes (Signed)
Office Visit    Patient Name: Bailey Castillo Date of Encounter: 11/19/2020  Primary Care Provider:  Clear, Walker Kehr, FNP Primary Cardiologist:  Lorine Bears, MD  Chief Complaint    54 year old female with history of ankylosis, hypertension, migraine headaches, GERD, intermittent asthma, and chest pain, who presents for follow-up related to hypertension.  Past Medical History    Past Medical History:  Diagnosis Date  . Ankylosis of multiple joints   . Asthma   . Chest pain    a. In setting of sudden death of son. Trop 0.05 in ED; b. 10/2018 Echo: EF 50-55%.  Marland Kitchen COVID-19 virus infection 07/2020  . HTN (hypertension)    a. 11/2018 Renal Duplex: Nl bilat renal arteries. No stenosis.  . Lupus [facility])    Past Surgical History:  Procedure Laterality Date  . AUGMENTATION MAMMAPLASTY Bilateral 2011  . KNEE ARTHROSCOPY      Allergies  Allergies  Allergen Reactions  . Leflunomide Other (See Comments)    Elevated BP Severe HTN Other reaction(s): Other (See Comments) Severe HTN Severe hypertension  Elevated BP Elevated BP  . Lisinopril Cough    cough Other reaction(s): Cough cough cough  . Plaquenil [Hydroxychloroquine Sulfate]     hearing loss in left ear  . Toprol Xl [Metoprolol Tartrate]     Headache     History of Present Illness    54 year old female with the above past medical history including ankylosis, hypertension, migraine headaches, GERD, intermittent asthma, and chest pain with mild troponin elevation.  November 08, 2018, following news that her 68 year old son had died suddenly, she developed severe substernal chest tightness radiating to her back, neck, and left shoulder.  In the ED, she had mild troponin elevation to 0.05.  ECG was nonacute.  She was seen by cardiology and echocardiogram showed an EF of 50-55% without wall motion abnormalities.  It was felt that presentation is most likely consistent with a stress-induced cardiomyopathy and  that lack of wall motion abnormalities on echo might be explained by delay between development of symptoms and echocardiogram.  She was initially placed on aspirin and beta-blocker therapy and then later placed on losartan by her primary care provider in the setting of hypertension.  Prior renal artery duplex performed in the setting of hypertension was negative for renal artery stenosis.  She was last seen in cardiology clinic in December 2019.  She had stopped beta-blocker therapy in the setting of headaches with subsequent resolution of headaches.  Blood pressure was stable at that time. Unfortunately, she developed COVID-19 in August 2021, and required hospitalization for 4 days.  She was treated with monoclonal antibodies prior to admission, but unfortunately developed worsening respiratory failure prompting hospitalization.  At this point, she is feeling much better but still notes an occasional cough.  She has come off of her Biologics for ankylosis and has changed her diet to cut out sugars and simple carbs.  In this setting, she has been feeling much better and has also lost weight.  In the setting weight loss, she has noted occasional orthostasis and blood pressures that have been trending in the low 100s.  She is still on losartan 50 mg daily would like to cut back.  She has not been having any significant chest pain but occasionally notes a brief and fleeting pain that has been occurring intermittently since her Covid infection.  She denies dyspnea, palpitations, PND, orthopnea, syncope, edema, or early satiety.  Home Medications  Prior to Admission medications   Medication Sig Start Date End Date Taking? Authorizing Provider  aspirin EC 81 MG tablet Take 81 mg by mouth daily.    [provider]  diclofenac sodium (VOLTAREN) 1 % GEL Apply 4 g topically 4 (four) times daily as needed.  08/17/18   [provider]  Ixekizumab 80 MG/ML SOAJ 160 mg once for the first dose,  followed by 80 mg every 4 weeks 12/12/18   [provider]  losartan (COZAAR) 50 MG tablet Take by mouth. 11/10/18   [provider]    Review of Systems    Occasional cough and occasional fleeting chest pain since Covid infection in August 2021.  Also has noticed occasional orthostatic lightheadedness with position changes.  She denies chest pain, palpitations, dyspnea, PND, orthopnea, syncope, edema, or early satiety.  All other systems reviewed and are otherwise negative except as noted above.  Physical Exam    VS:  BP 110/80 (BP Location: Left Arm, Patient Position: Sitting, Cuff Size: Normal)   Pulse 76   Ht 5\' 4"  (1.626 m)   Wt 145 lb 5 oz (65.9 kg)   SpO2 98%   BMI 24.94 kg/m  , BMI Body mass index is 24.94 kg/m. GEN: Well nourished, well developed, in no acute distress. HEENT: normal. Neck: Supple, no JVD, carotid bruits, or masses. Cardiac: RRR, no murmurs, rubs, or gallops. No clubbing, cyanosis, edema.  PT 2+ and equal bilaterally.  Respiratory:  Respirations regular and unlabored, clear to auscultation bilaterally. GI: Soft, nontender, nondistended, BS + x 4. MS: no deformity or atrophy. Skin: warm and dry, no rash. Neuro:  Strength and sensation are intact. Psych: Normal affect.  Accessory Clinical Findings    ECG personally reviewed by me today -regular sinus rhythm, 76- no acute changes.  Lab Results  Component Value Date   WBC 5.3 11/09/2018   HGB 12.0 11/09/2018   HCT 36.9 11/09/2018   MCV 94.4 11/09/2018   PLT 218 11/09/2018   Lab Results  Component Value Date   CREATININE 0.73 12/18/2018   BUN 15 12/18/2018   NA 140 12/18/2018   K 4.5 12/18/2018   CL 101 12/18/2018   CO2 22 12/18/2018    Assessment & Plan    1.  Essential hypertension: Patient previously with hypertension requiring beta-blocker and ARB therapy.  Previous renal duplex was normal.  She stopped beta-blocker therapy in 01-May-2018 the setting of headaches.  She has since  been on losartan 50 mg daily.  Following her Covid infection in August of this year, she has noted weight loss and in that setting, lower blood pressures.  She has occasionally noted orthostatic lightheadedness.  I am going to reduce her losartan to 25 mg daily.  She will continue to follow her blood pressure at home.  It is possible, that since recent dietary changes and with ongoing weight loss, that she may be able to come off of losartan altogether.  2.  History of chest pain: This was in the setting of severe emotional upset after learning of her son's death in 05/01/18.  Echo at that time showed normal LV function.  Since Covid infection earlier this year, she has had an occasional "twinge" of sharp and fleeting chest pain but no persistent pain or dyspnea.  No further work-up warranted at this time.  3.  Disposition: Follow-up in clinic in 1 year or sooner if necessary.  2020, NP 11/19/2020, 6:04 PM

## 2020-11-19 NOTE — Patient Instructions (Signed)
Medication Instructions:  - Your physician has recommended you make the following change in your medication:   1) DECREASE losartan to 25 mg- take 1 tablet by mouth once daily   *If you need a refill on your cardiac medications before your next appointment, please call your pharmacy*   Lab Work: None ordered  If you have labs (blood work) drawn today and your tests are completely normal, you will receive your results only by: Marland Kitchen MyChart Message (if you have MyChart) OR . A paper copy in the mail If you have any lab test that is abnormal or we need to change your treatment, we will call you to review the results.   Testing/Procedures: None ordered   Follow-Up: At [facility], you and your health needs are our priority.  As part of our continuing mission to provide you with exceptional heart care, we have created designated Provider Care Teams.  These Care Teams include your primary Cardiologist (physician) and Advanced Practice Providers (APPs -  Physician Assistants and Nurse Practitioners) who all work together to provide you with the care you need, when you need it.  We recommend signing up for the patient portal called "MyChart".  Sign up information is provided on this After Visit Summary.  MyChart is used to connect with patients for Virtual Visits (Telemedicine).  Patients are able to view lab/test results, encounter notes, upcoming appointments, etc.  Non-urgent messages can be sent to your provider as well.   To learn more about what you can do with MyChart, go to [email or web address].    Your next appointment:   1 year  The format for your next appointment:   In Person  Provider:   You may see Lorine Bears, MD or one of the following Advanced Practice Providers on your designated Care Team:    Nicolasa Ducking, NP  Eula Listen, PA-C  Marisue Ivan, PA-C  Cadence Woodward, [place]  Gillian Shields, NP    Other Instructions

## 2021-12-14 ENCOUNTER — Other Ambulatory Visit: Payer: Self-pay | Admitting: *Deleted

## 2021-12-14 MED ORDER — LOSARTAN POTASSIUM 25 MG PO TABS: 25.0000 mg | ORAL_TABLET | Freq: Every day | ORAL | 0 refills | Status: DC

## 2022-04-22 ENCOUNTER — Other Ambulatory Visit
Admission: RE | Admit: 2022-04-22 | Discharge: 2022-04-22 | Disposition: A | Discharge status: Home / Self Care | Payer: BC Managed Care – PPO | Attending: Nurse Practitioner | Admitting: Nurse Practitioner

## 2022-04-22 ENCOUNTER — Encounter: Payer: Self-pay | Admitting: Nurse Practitioner

## 2022-04-22 ENCOUNTER — Ambulatory Visit: Payer: BC Managed Care – PPO | Admitting: Nurse Practitioner

## 2022-04-22 VITALS — BP 120/78 | HR 75 | Ht 64.0 in | Wt 146.0 lb

## 2022-04-22 DIAGNOSIS — R079 Chest pain, unspecified: Secondary | ICD-10-CM | POA: Insufficient documentation

## 2022-04-22 DIAGNOSIS — I10 Essential (primary) hypertension: Secondary | ICD-10-CM

## 2022-04-22 DIAGNOSIS — R072 Precordial pain: Secondary | ICD-10-CM

## 2022-04-22 LAB — BASIC METABOLIC PANEL
Anion gap: 6 (ref 5–15)
BUN: 12 mg/dL (ref 6–20)
CO2: 29 mmol/L (ref 22–32)
Calcium: 9.2 mg/dL (ref 8.9–10.3)
Chloride: 108 mmol/L (ref 98–111)
Creatinine, Ser: 0.72 mg/dL (ref 0.44–1.00)
GFR, Estimated: >60 mL/min (ref >60)
Glucose, Bld: 94 mg/dL (ref 70–99)
Potassium: 4.1 mmol/L (ref 3.5–5.1)
Sodium: 143 mmol/L (ref 135–145)

## 2022-04-22 MED ORDER — LOSARTAN POTASSIUM 50 MG PO TABS: 50.0000 mg | ORAL_TABLET | Freq: Every day | ORAL | 3 refills | Status: DC

## 2022-04-22 MED ORDER — METOPROLOL TARTRATE 100 MG PO TABS: 100.0000 mg | ORAL_TABLET | Freq: Once | ORAL | 0 refills | Status: DC

## 2022-04-22 NOTE — Patient Instructions (Addendum)
Medication Instructions:  ?Your physician has recommended you make the following change in your medication:  ? ?INCREASE Losartan to 50 mg once a day ? ?*If you need a refill on your cardiac medications before your next appointment, please call your pharmacy* ? ? ?Lab Work: ?BMET today over at the [facility]. You will need to check in at registration to have this done.  ? ?Then Next week when you come Thursday for your CT go for repeat labs before or after your test has been done. You will go to [facility] and check in at registration for repeat labs.  ? ? ?If you have labs (blood work) drawn today and your tests are completely normal, you will receive your results only by: ?MyChart Message (if you have MyChart) OR ?A paper copy in the mail ?If you have any lab test that is abnormal or we need to change your treatment, we will call you to review the results. ? ? ?Testing/Procedures: ? ? ?Your cardiac CT is scheduled at the below location:  ? ?[facility] ?[address] ?[address] ?[place], [place] [address] ?[phone] ? ?Scheduled for May 4th at 08:00 am ? ?Please arrive 15 mins early for check-in and test prep. ? ?Please follow these instructions carefully (unless otherwise directed): ? ?Hold all erectile dysfunction medications at least 3 days (72 hrs) prior to test. ? ?On the Night Before the Test: ?Be sure to Drink plenty of water. ?Do not consume any caffeinated/decaffeinated beverages or chocolate 12 hours prior to your test. ?Do not take any antihistamines 12 hours prior to your test. ? ?On the Day of the Test: ?Drink plenty of water until 1 hour prior to the test. ?Do not eat any food 4 hours prior to the test. ?You may take your regular medications prior to the test.  ?Take metoprolol (Lopressor) 100 mg two hours prior to test. ?HOLD Furosemide/Hydrochlorothiazide morning of the test. ?FEMALES- please wear underwire-free bra if available, avoid dresses &  tight clothing ? ? ?     ?After the Test: ?Drink plenty of water. ?After receiving IV contrast, you may experience a mild flushed feeling. This is normal. ?On occasion, you may experience a mild rash up to 24 hours after the test. This is not dangerous. If this occurs, you can take Benadryl 25 mg and increase your fluid intake. ?If you experience trouble breathing, this can be serious. If it is severe call 911 IMMEDIATELY. If it is mild, please call our office. ?If you take any of these medications: Glipizide/Metformin, Avandament, Glucavance, please do not take 48 hours after completing test unless otherwise instructed. ? ?We will call to schedule your test 2-4 weeks out understanding that some insurance companies will need an authorization prior to the service being performed.  ? ?For non-scheduling related questions, please contact the cardiac imaging nurse navigator should you have any questions/concerns: ?Rockwell Alexandria, Cardiac Imaging Nurse Navigator ?Larey Brick, Cardiac Imaging Nurse Navigator ?[facility] ?Direct Office Dial: [phone]  ? ?For scheduling needs, including cancellations and rescheduling, please call [place], [phone]. ? ? ? ?Follow-Up: ?At [facility], you and your health needs are our priority.  As part of our continuing mission to provide you with exceptional heart care, we have created designated Provider Care Teams.  These Care Teams include your primary Cardiologist (physician) and Advanced Practice Providers (APPs -  Physician Assistants and Nurse Practitioners) who all work together to provide you with  the care you need, when you need it. ? ? ?Your next appointment:   ?1 month(s) ? ?The format for your next appointment:   ?In Person ? ?Provider:   ?Lorine Bears, MD or Nicolasa Ducking, NP  ? ? ? ? ? ?Important Information About Sugar ? ? ? ? ?  ?

## 2022-04-22 NOTE — Progress Notes (Signed)
? ? ?Office Visit  ?  ?Patient Name: Bailey Castillo ?Date of Encounter: 04/22/2022 ? ?Primary Care Provider:  Clear, Walker Kehr, FNP ?Primary Cardiologist:  Lorine Bears, MD ? ?Chief Complaint  ?  ?56 y/o ? w/ a h/o ankylosis, HTN, migraine headaches, GERD, intermittent asthma, and chest pain, who presents for follow-up related to elevated blood pressures and chest pain. ? ?Past Medical History  ?  ?Past Medical History:  ?Diagnosis Date  ? Ankylosis of multiple joints   ? Asthma   ? Chest pain   ? a. In setting of sudden death of son. Trop 0.05 in ED; b. 10/2018 Echo: EF 50-55%.  ? COVID-19 virus infection 07/2020  ? HTN (hypertension)   ? a. 11/2018 Renal Duplex: Nl bilat renal arteries. No stenosis.  ? Lupus (HCC)   ? ?Past Surgical History:  ?Procedure Laterality Date  ? AUGMENTATION MAMMAPLASTY Bilateral 2011  ? KNEE ARTHROSCOPY    ? ? ?Allergies ? ?Allergies  ?Allergen Reactions  ? Leflunomide Other (See Comments)  ?  Elevated BP ?Severe HTN ?Other reaction(s): Other (See Comments) ?Severe HTN ?Severe hypertension ? ?Elevated BP ?Elevated BP  ? Lisinopril Cough  ?  cough ?Other reaction(s): Cough ?cough ?cough  ? Plaquenil [Hydroxychloroquine Sulfate]   ?  hearing loss in left ear  ? Toprol Xl [Metoprolol Tartrate]   ?  Headache ?  ? ? ?History of Present Illness  ?  ?56 year old female with the above past medical history including ankylosis, hypertension, migraine headaches, GERD, intermittent asthma, and chest pain with mild troponin elevation.  In November 2019, following news that her 92 year old son had died suddenly, she developed severe substernal chest tightness radiating to her back, neck, and left shoulder.  In the emergency department, she was found to have mild troponin elevation to 0.05.  ECG was unremarkable.  She was seen by cardiology and echocardiogram showed an EF of 50 to 55% without wall motion abnormalities.  It was felt that presentation was most consistent with stress-induced  cardiomyopathy and that lack of wall motion abnormalities on echo might be explained by delay between development of symptoms and echocardiogram.  She was initially placed on aspirin and beta-blocker therapy and then later placed on losartan by her primary care provider in the setting of hypertension.  Prior renal artery duplex was negative for renal artery stenosis.  In August 2021, she required hospitalization for COVID-19 infection with worsening symptoms despite monoclonal antibody therapy.  She subsequently recovered well. ? ?Bailey Castillo was last seen in cardiology clinic in November 2021, at which time she noted occasional orthostatic lightheadedness, and we reduce losartan to 25 mg daily.  She is generally done well over the past year and a half though more recently, she has been been noticing that her blood pressures have been higher, often trending in the 140s at home.  Over the past few months, she has also noted occasional episodes of exertional chest heaviness, occurring a few times a month, usually without associated symptoms, lasting a few minutes, and resolving spontaneously.  When it occurs, it does stop her in her tracks and she will usually sit and rest for a little bit.  There does not seem to be any predictable pattern to her chest pain symptoms.  She does not typically experience dyspnea on exertion.  She denies palpitations, PND, orthopnea, dizziness, syncope, edema, or early satiety. ? ?Home Medications  ?  ?Current Outpatient Medications  ?Medication Sig Dispense Refill  ? aspirin EC 81  MG tablet Take 81 mg by mouth daily.    ? diclofenac sodium (VOLTAREN) 1 % GEL Apply 4 g topically 4 (four) times daily as needed.   3  ? esomeprazole (NEXIUM) 20 MG capsule Take by mouth daily.    ? Fluticasone-Salmeterol (ADVAIR) 100-50 MCG/DOSE AEPB Inhale 1 puff into the lungs daily.    ? losartan (COZAAR) 50 MG tablet Take 1 tablet (50 mg total) by mouth daily. 90 tablet 3  ? metoprolol tartrate  (LOPRESSOR) 100 MG tablet Take 1 tablet (100 mg total) by mouth once for 1 dose. Take 2 hours prior to your procedure. 1 tablet 0  ? SUMAtriptan (IMITREX) 100 MG tablet Take by mouth as needed.    ? SUMAtriptan Succinate Refill 6 MG/0.5ML SOCT Inject into the skin as needed.    ? celecoxib (CELEBREX) 200 MG capsule Take 200 mg by mouth 2 (two) times daily.    ? ?No current facility-administered medications for this visit.  ?  ? ?Review of Systems  ?  ?Sometimes resting but predominantly exertional chest discomfort over the past few months.  This is an increase in frequency above prior episodes.  She denies dyspnea exertion, palpitations, PND, orthopnea, dizziness, syncope, edema, or early satiety.  All other systems reviewed and are otherwise negative except as noted above. ?  ? ?Physical Exam  ?  ?VS:  BP 120/78 (BP Location: Left Arm, Patient Position: Sitting, Cuff Size: Normal)   Pulse 75   Ht 5\' 4"  (1.626 m)   Wt 146 lb (66.2 kg)   SpO2 99%   BMI 25.06 kg/m?  , BMI Body mass index is 25.06 kg/m?. ?    ?GEN: Well nourished, well developed, in no acute distress. ?HEENT: normal. ?Neck: Supple, no JVD, carotid bruits, or masses. ?Cardiac: RRR, no murmurs, rubs, or gallops. No clubbing, cyanosis, edema.  Radials/PT 2+ and equal bilaterally.  ?Respiratory:  Respirations regular and unlabored, clear to auscultation bilaterally. ?GI: Soft, nontender, nondistended, BS + x 4. ?MS: no deformity or atrophy. ?Skin: warm and dry, no rash. ?Neuro:  Strength and sensation are intact. ?Psych: Normal affect. ? ?Accessory Clinical Findings  ?  ?ECG personally reviewed by me today -sinus arrhythmia, 75- no acute changes. ? ?Lab Results  ?Component Value Date  ? WBC 5.3 11/09/2018  ? HGB 12.0 11/09/2018  ? HCT 36.9 11/09/2018  ? MCV 94.4 11/09/2018  ? PLT 218 11/09/2018  ? ?Lab Results  ?Component Value Date  ? CREATININE 0.73 12/18/2018  ? BUN 15 12/18/2018  ? NA 140 12/18/2018  ? K 4.5 12/18/2018  ? CL 101 12/18/2018  ? CO2  22 12/18/2018  ? ?Assessment & Plan  ?  ?1.  Precordial chest pain: Patient with previous admission in November 2019 in the setting of chest tightness and minimal troponin elevation, after learning of her son's sudden death.  Echo at that time showed normal LV function without wall motion abnormalities, and she has been medically managed since.  Over the past few months, she has had increased frequency of chest discomfort, predominantly occurring with exertion, lasting a few minutes, resolving with rest.  Because of the symptoms, she made this appointment today.  We will check a basic metabolic panel today and agreed to pursue a coronary CT angiogram. ? ?2.  Essential hypertension: Blood pressure stable today at 120/78, though she notes that at home, she is typically running in the 140s.  We agreed to increase losartan back to 50 mg daily.  She  will continue to follow blood pressures at home.  Follow-up basic metabolic panel in 1 to 2 weeks. ? ?3.  Disposition: Follow-up coronary CT angiogram.  Follow-up in clinic in 1 month or sooner if necessary. ? ? ?Nicolasa Ducking, NP ?04/22/2022, 5:14 PM ? ?

## 2022-04-28 ENCOUNTER — Telehealth (HOSPITAL_COMMUNITY): Payer: Self-pay | Admitting: *Deleted

## 2022-04-28 NOTE — Telephone Encounter (Signed)
Reaching out to patient to offer assistance regarding upcoming cardiac imaging study; pt verbalizes understanding of appt date/time, parking situation and where to check in, pre-test NPO status and medications ordered, and verified current allergies; name and call back number provided for further questions should they arise ? ?Darrall Strey RN Navigator Cardiac Imaging ?[facility] ?[phone] office ?[phone] cell ? ?Patient to take 100mg metoprolol tartrate two hours prior to her cardiac CT scan.  ?

## 2022-04-29 ENCOUNTER — Other Ambulatory Visit
Admission: RE | Admit: 2022-04-29 | Discharge: 2022-04-29 | Disposition: A | Discharge status: Home / Self Care | Payer: BC Managed Care – PPO | Source: Home / Self Care | Attending: Nurse Practitioner | Admitting: Nurse Practitioner

## 2022-04-29 ENCOUNTER — Ambulatory Visit
Admission: RE | Admit: 2022-04-29 | Discharge: 2022-04-29 | Disposition: A | Discharge status: Home / Self Care | Payer: BC Managed Care – PPO | Source: Ambulatory Visit | Attending: Nurse Practitioner | Admitting: Nurse Practitioner

## 2022-04-29 DIAGNOSIS — I10 Essential (primary) hypertension: Secondary | ICD-10-CM

## 2022-04-29 DIAGNOSIS — R072 Precordial pain: Secondary | ICD-10-CM | POA: Insufficient documentation

## 2022-04-29 LAB — BASIC METABOLIC PANEL
Anion gap: 6 (ref 5–15)
BUN: 19 mg/dL (ref 6–20)
CO2: 26 mmol/L (ref 22–32)
Calcium: 9.3 mg/dL (ref 8.9–10.3)
Chloride: 102 mmol/L (ref 98–111)
Creatinine, Ser: 0.7 mg/dL (ref 0.44–1.00)
GFR, Estimated: >60 mL/min (ref >60)
Glucose, Bld: 86 mg/dL (ref 70–99)
Potassium: 4.6 mmol/L (ref 3.5–5.1)
Sodium: 134 mmol/L — ABNORMAL LOW (ref 135–145)

## 2022-04-29 MED ORDER — METOPROLOL TARTRATE 5 MG/5ML IV SOLN: 10.0000 mg | Freq: Once | INTRAVENOUS | Status: DC

## 2022-04-29 MED ORDER — IOHEXOL 350 MG/ML SOLN
75.0000 mL | Freq: Once | INTRAVENOUS | Status: AC | PRN
  Administered 2022-04-29: 75 mL via INTRAVENOUS

## 2022-04-29 MED ORDER — NITROGLYCERIN 0.4 MG SL SUBL
0.8000 mg | SUBLINGUAL_TABLET | Freq: Once | SUBLINGUAL | Status: AC
  Administered 2022-04-29: 0.8 mg via SUBLINGUAL

## 2022-04-29 NOTE — Progress Notes (Signed)
Patient tolerated procedure well. Ambulate w/o difficulty. Denies light headedness or being dizzy. Sitting in chair drinking water provided. Encouraged to drink extra water today and reasoning explained. Verbalized understanding. All questions answered. ABC intact. No further needs. Discharge from procedure area w/o issues.   °

## 2022-05-19 ENCOUNTER — Encounter: Payer: Self-pay | Admitting: Nurse Practitioner

## 2022-05-19 ENCOUNTER — Ambulatory Visit: Payer: BC Managed Care – PPO | Admitting: Nurse Practitioner

## 2022-05-19 VITALS — BP 120/72 | HR 70 | Ht 64.0 in | Wt 146.0 lb

## 2022-05-19 DIAGNOSIS — R072 Precordial pain: Secondary | ICD-10-CM | POA: Diagnosis not present

## 2022-05-19 DIAGNOSIS — I10 Essential (primary) hypertension: Secondary | ICD-10-CM

## 2022-05-19 NOTE — Progress Notes (Signed)
Office Visit    Patient Name: Bailey Castillo Date of Encounter: 05/19/2022  Primary Care Provider:  Clear, Walker KehrKelli Elizabeth, FNP Primary Cardiologist:  Lorine BearsMuhammad Arida, MD  Chief Complaint    56 year old female with a history of ankylosis, hypertension, migraine headaches, GERD, intermittent asthma, and chest pain, presents for follow-up related to chest pain after recent coronary CT angiogram.  Past Medical History    Past Medical History:  Diagnosis Date   Ankylosis of multiple joints    Asthma    Chest pain    a. In setting of sudden death of son. Trop 0.05 in ED; b. 10/2018 Echo: EF 50-55%; c. 04/2022 Cor CTA: Ca2+ = 0. Nl cors.   COVID-19 virus infection 07/2020   HTN (hypertension)    a. 11/2018 Renal Duplex: Nl bilat renal arteries. No stenosis.   Lupus [facility](HCC)    Past Surgical History:  Procedure Laterality Date   AUGMENTATION MAMMAPLASTY Bilateral 2011   KNEE ARTHROSCOPY      Allergies  Allergies  Allergen Reactions   Leflunomide Other (See Comments)    Elevated BP Severe HTN Other reaction(s): Other (See Comments) Severe HTN Severe hypertension  Elevated BP Elevated BP   Lisinopril Cough    cough Other reaction(s): Cough cough cough   Plaquenil [Hydroxychloroquine Sulfate]     hearing loss in left ear   Toprol Xl [Metoprolol Tartrate]     Headache     History of Present Illness    56 year old female with the above past medical history including ankylosis, hypertension, migraine headaches, GERD, intermittent asthma, and chest pain with mild troponin elevation.  In November 2019, following news that her 55 year old son had died suddenly, she developed severe substernal chest tightness rating to her back, neck, and left shoulder.  She presented to the emergency department and was found to have a mild troponin elevation at 0.05.  ECG was unremarkable.  She was seen by cardiology, and echocardiogram showed an EF of 50 to 55% without wall motion  abnormalities.  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  She was initially placed on aspirin and beta-blocker therapy and then later placed on losartan by primary care in the setting of hypertension.  Prior renal artery duplex was negative for renal artery stenosis.  In August 2021, she required hospitalization for COVID-19 infection with worsening symptoms despite monoclonal antibody therapy.  She subsequently recovered well though notes some degree of chronic dyspnea since.  [name] was last in cardiology clinic on April 27, at which time she noted some elevation in blood pressures as well as episodic exertional chest heaviness without associated symptoms lasting a few minutes, and resolving spontaneously.  We increased her losartan back to 50 mg daily in the setting of elevated blood pressures at home.  We also obtained a coronary CT angiogram which showed a calcium score of 0 and normal coronary arteries.  Since her last visit, she has continued to have episodic chest discomfort, which is typically mild and brief in nature.  They are not typically associated symptoms.  Reassurance provided today regarding findings of normal coronary arteries on CTA.  She notes that overall, she feels "normal for[her]."  No complaints of palpitations, PND, orthopnea, dizziness, syncope, edema, or early satiety.  Home Medications    Current Outpatient Medications  Medication Sig Dispense Refill   celecoxib (  CELEBREX) 200 MG capsule Take 200 mg by mouth 2 (two) times daily.     diclofenac sodium (VOLTAREN) 1 % GEL Apply 4 g topically 4 (four) times daily as needed.   3   esomeprazole (NEXIUM) 20 MG capsule Take by mouth daily.     Fluticasone-Salmeterol (ADVAIR) 100-50 MCG/DOSE AEPB Inhale 1 puff into the lungs daily.     losartan (COZAAR) 50 MG  tablet Take 1 tablet (50 mg total) by mouth daily. 90 tablet 3   SUMAtriptan (IMITREX) 100 MG tablet Take by mouth as needed.     SUMAtriptan Succinate Refill 6 MG/0.5ML SOCT Inject into the skin as needed.     No current facility-administered medications for this visit.     Review of Systems    Occasional brief episodes of chest pain-unchanged since last visit.  Some degree of dyspnea on exertion ever since COVID infection in 2021.  No palpitations, PND, orthopnea, dizziness, syncope, edema, or early satiety.  All other systems reviewed and are otherwise negative except as noted above.    Physical Exam    VS:  BP 120/72 (BP Location: Left Arm, Patient Position: Sitting, Cuff Size: Normal)   Pulse 70   Ht 5\' 4"  (1.626 m)   Wt 146 lb (66.2 kg)   SpO2 98%   BMI 25.06 kg/m  , BMI Body mass index is 25.06 kg/m.     GEN: Well nourished, well developed, in no acute distress. HEENT: normal. Neck: Supple, no JVD, carotid bruits, or masses. Cardiac: RRR, no murmurs, rubs, or gallops. No clubbing, cyanosis, edema.  Radials/PT 2+ and equal bilaterally.  Respiratory:  Respirations regular and unlabored, clear to auscultation bilaterally. GI: Soft, nontender, nondistended, BS + x 4. MS: no deformity or atrophy. Skin: warm and dry, no rash. Neuro:  Strength and sensation are intact. Psych: Normal affect.  Accessory Clinical Findings     Lab Results  Component Value Date   WBC 5.3 11/09/2018   HGB 12.0 11/09/2018   HCT 36.9 11/09/2018   MCV 94.4 11/09/2018   PLT 218 11/09/2018   Lab Results  Component Value Date   CREATININE 0.70 04/29/2022   BUN 19 04/29/2022   NA 134 (L) 04/29/2022   K 4.6 04/29/2022   CL 102 04/29/2022   CO2 26 04/29/2022    Assessment & Plan    1.  Precordial chest pain: Patient with previous admission in November 2019, in the setting of chest tightness and minimal troponin elevation, after learning of her son's death.  Echo at that time showed normal LV  function without wall motion abnormalities, and she has been medically managed.  Prior to her last visit, she noted increasing frequency of chest discomfort, predominantly occurring with exertion, lasting a few minutes, resolving with rest.  Coronary CT angiogram was undertaken and showed normal coronary arteries with a calcium score of 0, and no significant extracardiac findings.  She continues to have occasional chest discomfort without associated symptoms lasting brief periods of time, and resolving spontaneously.  Reassurance provided today.  No further ischemic evaluation warranted at this time.  She will continue to follow with primary care.  2.  Essential hypertension: Blood pressure stable at 120/72 since increasing losartan to 50 mg daily.  Renal function electrolytes were stable on May 4.  3.  Hypothyroidism: Patient presented with labs from a hormone specialist that she sees in [place].  TSH was recently mildly elevated at 5.3 with a free T4 that was mildly depressed.  ?  Some degree of sick euthyroid.  Patient does have a history of fatigue.  She plans to follow-up with her hormonal specialist as well as primary care.    4.  Disposition: Follow-up in 6 months or sooner if necessary.   Nicolasa Ducking, NP 05/19/2022, 4:18 PM

## 2022-05-19 NOTE — Patient Instructions (Signed)
Medication Instructions:  No changes at this time.   *If you need a refill on your cardiac medications before your next appointment, please call your pharmacy*   Lab Work: None  If you have labs (blood work) drawn today and your tests are completely normal, you will receive your results only by: MyChart Message (if you have MyChart) OR A paper copy in the mail If you have any lab test that is abnormal or we need to change your treatment, we will call you to review the results.   Testing/Procedures: None   Follow-Up: At [facility], you and your health needs are our priority.  As part of our continuing mission to provide you with exceptional heart care, we have created designated Provider Care Teams.  These Care Teams include your primary Cardiologist (physician) and Advanced Practice Providers (APPs -  Physician Assistants and Nurse Practitioners) who all work together to provide you with the care you need, when you need it.   Your next appointment:   6 month(s)  The format for your next appointment:   In Person  Provider:   Muhammad Arida, MD or Christopher Berge, NP    Important Information About Sugar       

## 2022-11-16 ENCOUNTER — Ambulatory Visit: Payer: BC Managed Care – PPO | Admitting: Cardiovascular Disease

## 2022-12-14 IMAGING — CT CT HEART MORP W/ CTA COR W/ SCORE W/ CA W/CM &/OR W/O CM
2 of 11 series · 7 of 20 positions shown, 8 images · non-contrast
Comparison: None.

Addendum:
CLINICAL DATA: Chest pain

EXAM:
Cardiac/Coronary  CTA
TECHNIQUE: The patient was scanned on a Siemens Somatom go.Top scanner.

[Series 4: multiphase % cta coronary 0.60 · axial · 0.34mm/px · z∈[-1111,-1027]mm · 4 of 3828 slices shown, 5 images]
[im 766/3828  vessel]
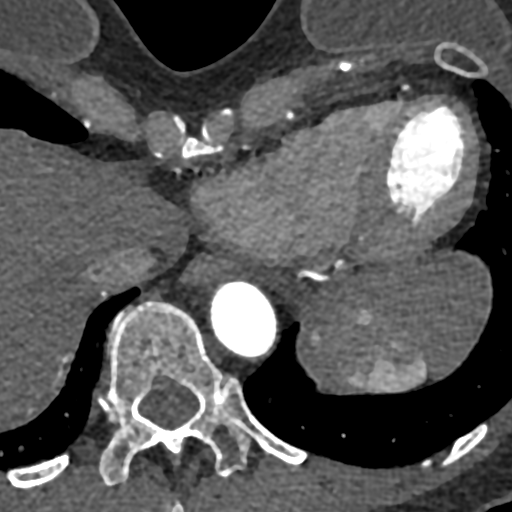
[im 766/3828  lung]
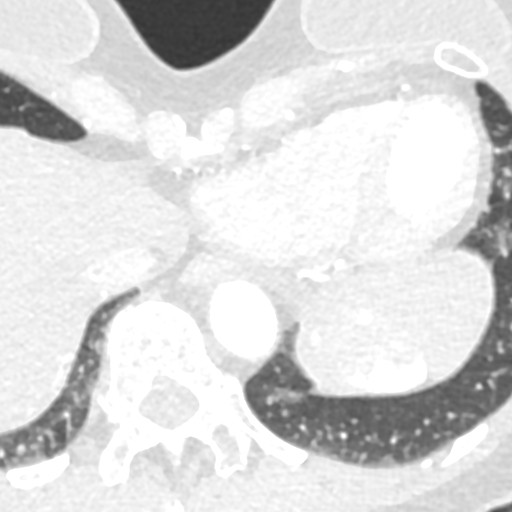
[im 1531/3828  vessel]
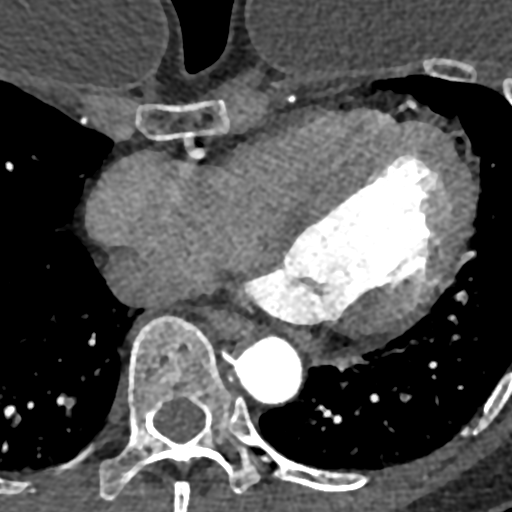
[im 2297/3828  vessel]
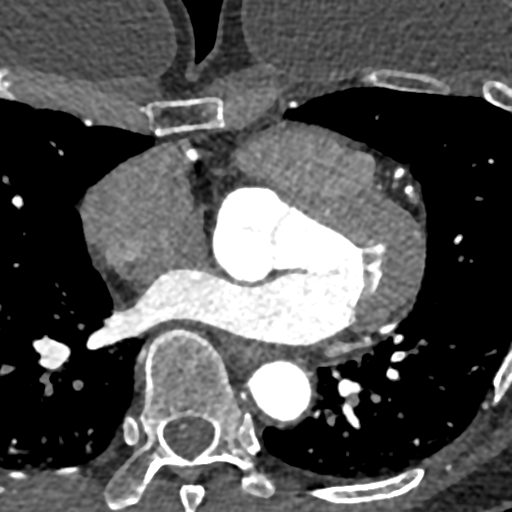
[im 3062/3828  vessel]
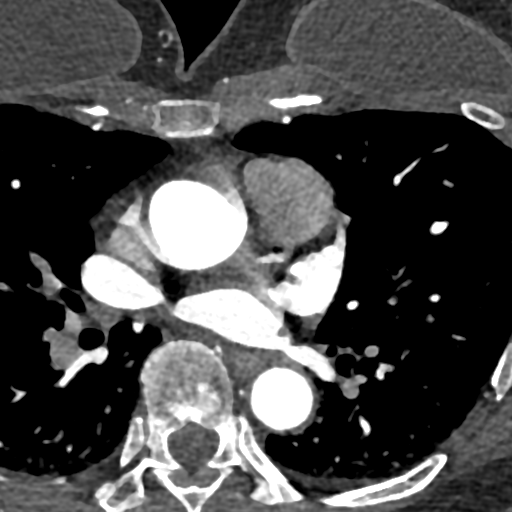

[Series 33: ms multiphase cta coronary 0.60 · axial · 0.34mm/px · z∈[-1104,-1034]mm · 3 of 3132 slices shown]
[im 783/3132  vessel]
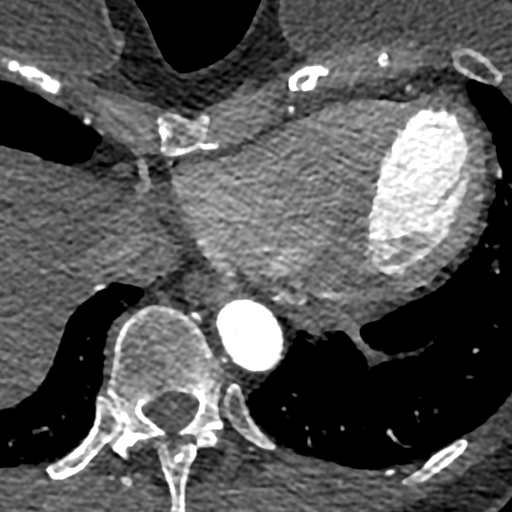
[im 1566/3132  vessel]
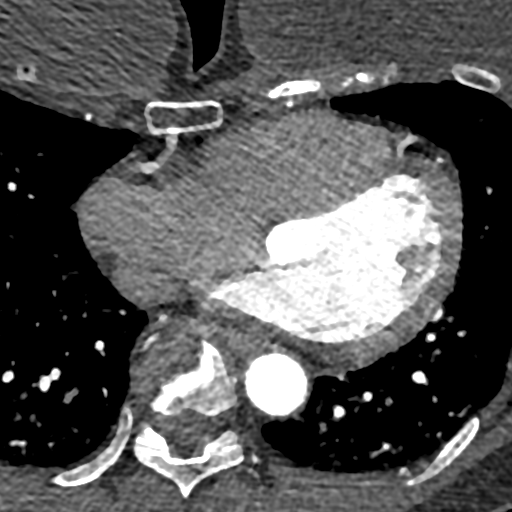
[im 2349/3132  vessel]
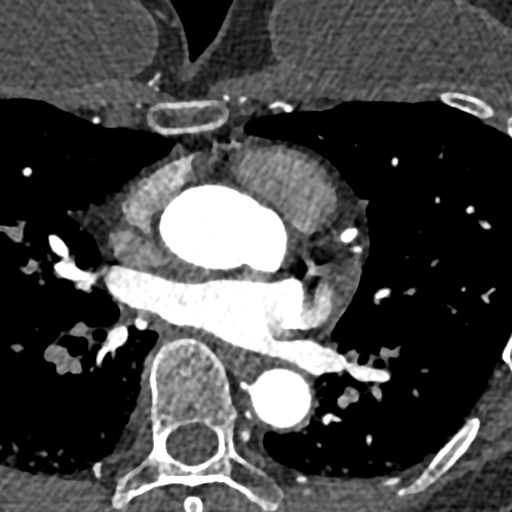

[7 of 20 positions shown; findings below may reference images not displayed]



Aortic Valve:  Trileaflet.  No calcifications.

Coronary Arteries:  Normal coronary origin.  Right dominance.

RCA is a dominant artery that gives rise to PDA and PLA. There is no
plaque.

Left main gives rise to LAD and LCX arteries.

LAD has no plaque.

LCX is a non-dominant artery that gives rise to two obtuse marginal
branches. There is no plaque.

Other findings:

Normal pulmonary vein drainage into the left atrium.

Normal left atrial appendage without a thrombus.

Normal size of the pulmonary artery.
IMPRESSION: 1. Normal coronary calcium score of 0. Patient is low risk for
coronary events.

2. Normal coronary origin with right dominance.

3. No evidence of CAD.

4. CAD-RADS 0. Consider non-atherosclerotic causes of chest pain.

EXAM:
OVER-READ INTERPRETATION  CT CHEST

The following report is an over-read performed by radiologist Dr.
over-read does not include interpretation of cardiac or coronary
anatomy or pathology. The coronary CTA interpretation by the
cardiologist is attached.
FINDINGS: No definite abnormality seen involving the visualized portion of the
mediastinum. No definite abnormality seen involving the visualized
portions of the extracardiac vascular structures. No definite
abnormality seen involving the visualized portion of upper abdomen.
Visualized pulmonary parenchyma is unremarkable.
IMPRESSION: No definite abnormality seen involving the extracardiac structures
of the visualized chest.



Aortic Valve:  Trileaflet.  No calcifications.

Coronary Arteries:  Normal coronary origin.  Right dominance.

RCA is a dominant artery that gives rise to PDA and PLA. There is no
plaque.

Left main gives rise to LAD and LCX arteries.

LAD has no plaque.

LCX is a non-dominant artery that gives rise to two obtuse marginal
branches. There is no plaque.

Other findings:

Normal pulmonary vein drainage into the left atrium.

Normal left atrial appendage without a thrombus.

Normal size of the pulmonary artery.
IMPRESSION: 1. Normal coronary calcium score of 0. Patient is low risk for
coronary events.

2. Normal coronary origin with right dominance.

3. No evidence of CAD.

4. CAD-RADS 0. Consider non-atherosclerotic causes of chest pain.

## 2023-05-03 ENCOUNTER — Other Ambulatory Visit: Payer: Self-pay

## 2023-05-03 MED ORDER — LOSARTAN POTASSIUM 50 MG PO TABS: 50.0000 mg | ORAL_TABLET | Freq: Every day | ORAL | 0 refills | Status: DC

## 2023-05-17 ENCOUNTER — Ambulatory Visit: Payer: BC Managed Care – PPO | Attending: Cardiovascular Disease | Admitting: Cardiovascular Disease

## 2023-05-17 VITALS — BP 110/78 | HR 78 | Ht 64.0 in | Wt 147.2 lb

## 2023-05-17 DIAGNOSIS — R072 Precordial pain: Secondary | ICD-10-CM

## 2023-05-17 DIAGNOSIS — I10 Essential (primary) hypertension: Secondary | ICD-10-CM | POA: Diagnosis not present

## 2023-05-17 NOTE — Patient Instructions (Signed)
Medication Instructions:  No changes *If you need a refill on your cardiac medications before your next appointment, please call your pharmacy*   Lab Work: None ordered If you have labs (blood work) drawn today and your tests are completely normal, you will receive your results only by: MyChart Message (if you have MyChart) OR A paper copy in the mail If you have any lab test that is abnormal or we need to change your treatment, we will call you to review the results.   Testing/Procedures: None ordered   Follow-Up: At [facility], you and your health needs are our priority.  As part of our continuing mission to provide you with exceptional heart care, we have created designated Provider Care Teams.  These Care Teams include your primary Cardiologist (physician) and Advanced Practice Providers (APPs -  Physician Assistants and Nurse Practitioners) who all work together to provide you with the care you need, when you need it.  We recommend signing up for the patient portal called "MyChart".  Sign up information is provided on this After Visit Summary.  MyChart is used to connect with patients for Virtual Visits (Telemedicine).  Patients are able to view lab/test results, encounter notes, upcoming appointments, etc.  Non-urgent messages can be sent to your provider as well.   To learn more about what you can do with MyChart, go to [email or web address].    Your next appointment:   Follow up as needed  

## 2023-05-17 NOTE — Progress Notes (Signed)
Cardiology Office Note   Date:  05/17/2023   ID:  Bailey Castillo, DOB 09/11/1966, MRN [id number]  PCP:  Cathlean Cower Walker Kehr, FNP  Cardiologist:   Lorine Bears, MD   Chief Complaint  Patient presents with   Follow-up    6 month f/u no complaints today. Meds reviewed verbally with pt.      History of Present Illness: Bailey Castillo is a 57 y.o. female who presents for a follow-up visit regarding chest pain. She has known history of ankylosing spondylitis, essential hypertension, migraine headaches, GERD and intermittent asthma.  She was hospitalized in November 2019 with chest pain after the sudden death of her 65 year old son.  Her troponin was mildly elevated.  Echocardiogram showed an EF of 50 to 55%.  Her presentation was felt to be due to stress-induced cardiomyopathy. She was hospitalized in August 2021 due to COVID-19 infection. She had elevated blood pressure in 2022 and the dose of losartan was increased.  Due to chest pain, she underwent cardiac CTA in May 2023 which showed a calcium score of 0 with normal coronary arteries.  She has been doing well with rare episodes of chest pain.  No significant shortness of breath.  No symptoms with exercise.  Past Medical History:  Diagnosis Date   Ankylosis of multiple joints    Asthma    Chest pain    a. In setting of sudden death of son. Trop 0.05 in ED; b. 10/2018 Echo: EF 50-55%; c. 04/2022 Cor CTA: Ca2+ = 0. Nl cors.   COVID-19 virus infection 07/2020   HTN (hypertension)    a. 11/2018 Renal Duplex: Nl bilat renal arteries. No stenosis.   Lupus (HCC)     Past Surgical History:  Procedure Laterality Date   AUGMENTATION MAMMAPLASTY Bilateral 2011   KNEE ARTHROSCOPY       Current Outpatient Medications  Medication Sig Dispense Refill   celecoxib (CELEBREX) 200 MG capsule Take 200 mg by mouth 2 (two) times daily.     diclofenac sodium (VOLTAREN) 1 % GEL Apply 4 g topically 4 (four) times daily as needed.   3    esomeprazole (NEXIUM) 20 MG capsule Take by mouth daily.     Fluticasone-Salmeterol (ADVAIR) 100-50 MCG/DOSE AEPB Inhale 1 puff into the lungs daily.     losartan (COZAAR) 50 MG tablet Take 1 tablet (50 mg total) by mouth daily. 30 tablet 0   progesterone (PROMETRIUM) 200 MG capsule Take 200 mg by mouth daily.     SUMAtriptan (IMITREX) 100 MG tablet Take by mouth as needed.     SUMAtriptan Succinate Refill 6 MG/0.5ML SOCT Inject into the skin as needed.     thyroid (ARMOUR) 90 MG tablet Take 90 mg by mouth daily.     No current facility-administered medications for this visit.    Allergies:   Leflunomide, Lisinopril, Plaquenil [hydroxychloroquine sulfate], and Toprol xl [metoprolol tartrate]    Social History:  The patient  reports that she has never smoked. She has never used smokeless tobacco. She reports current alcohol use. She reports that she does not use drugs.   Family History:  The patient's family history includes Asthma in her sister; Colon cancer in her paternal grandmother; Hypertension in her mother.    ROS:  Please see the history of present illness.   Otherwise, review of systems are positive for none.   All other systems are reviewed and negative.    PHYSICAL EXAM: VS:  BP 110/78 (BP  Location: Left Arm, Patient Position: Sitting, Cuff Size: Normal) Comment: Recheck after EKG  Pulse 78   Ht 5\' 4"  (1.626 m)   Wt 147 lb 4 oz (66.8 kg)   SpO2 98%   BMI 25.28 kg/m  , BMI Body mass index is 25.28 kg/m. GEN: Well nourished, well developed, in no acute distress  HEENT: normal  Neck: no JVD, carotid bruits, or masses Cardiac: RRR; no murmurs, rubs, or gallops,no edema  Respiratory:  clear to auscultation bilaterally, normal work of breathing GI: soft, nontender, nondistended, + BS MS: no deformity or atrophy  Skin: warm and dry, no rash Neuro:  Strength and sensation are intact Psych: euthymic mood, full affect   EKG:  EKG is ordered today. The ekg ordered today  demonstrates normal sinus rhythm with no significant ST or T wave changes.   Recent Labs: No results found for requested labs within last 365 days.    Lipid Panel No results found for: "CHOL", "TRIG", "HDL", "CHOLHDL", "VLDL", "LDLCALC", "LDLDIRECT"    Wt Readings from Last 3 Encounters:  05/17/23 147 lb 4 oz (66.8 kg)  05/19/22 146 lb (66.2 kg)  04/22/22 146 lb (66.2 kg)          No data to display            ASSESSMENT AND PLAN:  1.  Atypical chest pain: Cardiac CTA last year showed normal coronary arteries with calcium score of 0.  Her EKG is normal today.  No further workup is needed.    2.  Essential hypertension: Blood pressure is reasonably controlled on losartan 50 mg once daily.  Previous renal artery duplex in 2019 showed no evidence of renal artery stenosis.    Disposition:   FU as needed.  Signed,  Lorine Bears, MD  05/17/2023 5:07 PM    [facility]

## 2023-08-02 ENCOUNTER — Other Ambulatory Visit: Payer: Self-pay

## 2023-08-02 MED ORDER — LOSARTAN POTASSIUM 50 MG PO TABS: 50.0000 mg | ORAL_TABLET | Freq: Every day | ORAL | 2 refills | Status: AC
